# Patient Record
Sex: Male | Born: 1975
Health system: Southern US, Community
[De-identification: ages and names within clinical notes are randomized; demographics above are authoritative.]

## PROBLEM LIST (undated history)

## (undated) DIAGNOSIS — I10 Essential (primary) hypertension: Secondary | ICD-10-CM

---

## 2002-08-23 ENCOUNTER — Emergency Department (HOSPITAL_COMMUNITY): Admission: EM | Admit: 2002-08-23 | Discharge: 2002-08-23 | Payer: Self-pay | Admitting: Emergency Medicine

## 2002-08-23 ENCOUNTER — Encounter: Payer: Self-pay | Admitting: Emergency Medicine

## 2006-03-14 ENCOUNTER — Emergency Department (HOSPITAL_COMMUNITY): Admission: EM | Admit: 2006-03-14 | Discharge: 2006-03-14 | Payer: Self-pay | Admitting: Emergency Medicine

## 2006-03-24 ENCOUNTER — Ambulatory Visit (HOSPITAL_COMMUNITY): Admission: RE | Admit: 2006-03-24 | Discharge: 2006-03-24 | Payer: Self-pay | Admitting: Chiropractic Medicine

## 2016-01-10 ENCOUNTER — Emergency Department (HOSPITAL_COMMUNITY): Payer: No Typology Code available for payment source

## 2016-01-10 ENCOUNTER — Encounter (HOSPITAL_COMMUNITY): Payer: Self-pay | Admitting: Emergency Medicine

## 2016-01-10 ENCOUNTER — Ambulatory Visit: Payer: Self-pay

## 2016-01-10 ENCOUNTER — Emergency Department (HOSPITAL_COMMUNITY)
Admission: EM | Admit: 2016-01-10 | Discharge: 2016-01-10 | Disposition: A | Discharge status: Home / Self Care | Payer: No Typology Code available for payment source | Attending: Emergency Medicine | Admitting: Emergency Medicine

## 2016-01-10 DIAGNOSIS — R531 Weakness: Secondary | ICD-10-CM | POA: Insufficient documentation

## 2016-01-10 DIAGNOSIS — R109 Unspecified abdominal pain: Secondary | ICD-10-CM | POA: Diagnosis not present

## 2016-01-10 DIAGNOSIS — R0789 Other chest pain: Secondary | ICD-10-CM | POA: Diagnosis not present

## 2016-01-10 DIAGNOSIS — M25512 Pain in left shoulder: Secondary | ICD-10-CM | POA: Insufficient documentation

## 2016-01-10 DIAGNOSIS — Y999 Unspecified external cause status: Secondary | ICD-10-CM | POA: Diagnosis not present

## 2016-01-10 DIAGNOSIS — M542 Cervicalgia: Secondary | ICD-10-CM | POA: Diagnosis not present

## 2016-01-10 DIAGNOSIS — Y939 Activity, unspecified: Secondary | ICD-10-CM | POA: Insufficient documentation

## 2016-01-10 DIAGNOSIS — Y92410 Unspecified street and highway as the place of occurrence of the external cause: Secondary | ICD-10-CM | POA: Diagnosis not present

## 2016-01-10 LAB — I-STAT CHEM 8, ED
BUN: 14 mg/dL (ref 6–20)
Calcium, Ion: 1.17 mmol/L (ref 1.12–1.23)
Chloride: 103 mmol/L (ref 101–111)
Creatinine, Ser: 1 mg/dL (ref 0.61–1.24)
GLUCOSE: 97 mg/dL (ref 65–99)
HEMATOCRIT: 47 % (ref 39.0–52.0)
HEMOGLOBIN: 16 g/dL (ref 13.0–17.0)
POTASSIUM: 4.1 mmol/L (ref 3.5–5.1)
Sodium: 141 mmol/L (ref 135–145)
TCO2: 26 mmol/L (ref 0–100)

## 2016-01-10 MED ORDER — IOPAMIDOL (ISOVUE-300) INJECTION 61%
INTRAVENOUS | Status: AC
  Administered 2016-01-10: 100 mL
  Filled 2016-01-10: qty 100

## 2016-01-10 MED ORDER — CYCLOBENZAPRINE HCL 10 MG PO TABS: 10.0000 mg | ORAL_TABLET | Freq: Every day | ORAL | Status: DC
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
## 2016-02-19 ENCOUNTER — Ambulatory Visit (INDEPENDENT_AMBULATORY_CARE_PROVIDER_SITE_OTHER): Payer: Commercial Managed Care - PPO

## 2016-02-19 ENCOUNTER — Ambulatory Visit (INDEPENDENT_AMBULATORY_CARE_PROVIDER_SITE_OTHER): Payer: Commercial Managed Care - PPO | Admitting: Physician Assistant

## 2016-02-19 VITALS — BP 184/98 | HR 112 | Temp 98.0°F | Resp 17 | Ht 68.0 in | Wt 233.0 lb

## 2016-02-19 DIAGNOSIS — M542 Cervicalgia: Secondary | ICD-10-CM | POA: Diagnosis not present

## 2016-02-19 DIAGNOSIS — R51 Headache: Secondary | ICD-10-CM | POA: Diagnosis not present

## 2016-02-19 DIAGNOSIS — F0781 Postconcussional syndrome: Secondary | ICD-10-CM

## 2016-02-19 DIAGNOSIS — R519 Headache, unspecified: Secondary | ICD-10-CM

## 2016-02-19 LAB — POCT CBC
Granulocyte percent: 50 %G (ref 37–80)
HEMATOCRIT: 47.5 % (ref 43.5–53.7)
HEMOGLOBIN: 16.1 g/dL (ref 14.1–18.1)
LYMPH, POC: 3.9 — AB (ref 0.6–3.4)
MCH, POC: 26.4 pg — AB (ref 27–31.2)
MCHC: 33.9 g/dL (ref 31.8–35.4)
MCV: 77.9 fL — AB (ref 80–97)
MID (CBC): 0.7 (ref 0–0.9)
MPV: 8.5 fL (ref 0–99.8)
POC GRANULOCYTE: 4.8 (ref 2–6.9)
POC LYMPH %: 41.5 % (ref 10–50)
POC MID %: 7.9 %M (ref 0–12)
Platelet Count, POC: 199 10*3/uL (ref 142–424)
RBC: 6.1 M/uL (ref 4.69–6.13)
RDW, POC: 14.4 %
WBC: 9.4 10*3/uL (ref 4.6–10.2)

## 2016-02-19 LAB — BASIC METABOLIC PANEL
BUN: 11 mg/dL (ref 7–25)
CALCIUM: 9.9 mg/dL (ref 8.6–10.3)
CO2: 28 mmol/L (ref 20–31)
CREATININE: 1 mg/dL (ref 0.60–1.35)
Chloride: 103 mmol/L (ref 98–110)
Glucose, Bld: 88 mg/dL (ref 65–99)
Potassium: 4.7 mmol/L (ref 3.5–5.3)
Sodium: 141 mmol/L (ref 135–146)

## 2016-02-19 MED ORDER — AMITRIPTYLINE HCL 10 MG PO TABS: 25.0000 mg | ORAL_TABLET | Freq: Every day | ORAL | Status: DC

## 2016-02-19 MED ORDER — CYCLOBENZAPRINE HCL 10 MG PO TABS: 10.0000 mg | ORAL_TABLET | Freq: Three times a day (TID) | ORAL | 0 refills | Status: DC | PRN

## 2016-02-19 MED ORDER — AMITRIPTYLINE HCL 25 MG PO TABS: 25.0000 mg | ORAL_TABLET | Freq: Every day | ORAL | 1 refills | Status: DC

## 2016-02-19 MED ORDER — CYCLOBENZAPRINE HCL 10 MG PO TABS
10.0000 mg | ORAL_TABLET | Freq: Two times a day (BID) | ORAL | 0 refills | Status: DC | PRN
Start: 2016-02-19 — End: 2020-04-16
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
## 2016-02-19 NOTE — Patient Instructions (Addendum)
     IF you received an x-ray today, you will receive an invoice from [facility]. Please contact [facility] at [phone] with questions or concerns regarding your invoice.   IF you received labwork today, you will receive an invoice from [employer]. Please contact [facility] at [phone] with questions or concerns regarding your invoice.   Our billing staff will not be able to assist you with questions regarding bills from these companies.  You will be contacted with the lab results as soon as they are available. The fastest way to get your results is to activate your My Chart account. Instructions are located on the last page of this paperwork. If you have not heard from [place] regarding the results in 2 weeks, please contact this office.     I am placing a referral to neurology. We will try the amitriptyline. Please take the flexeril twice per day as needed.  Do not take at night with the amitriptyline at this time.

## 2016-04-15 ENCOUNTER — Other Ambulatory Visit: Payer: Self-pay | Admitting: Physician Assistant

## 2016-04-15 DIAGNOSIS — R519 Headache, unspecified: Secondary | ICD-10-CM

## 2016-04-15 DIAGNOSIS — R51 Headache: Principal | ICD-10-CM

## 2016-04-15 DIAGNOSIS — M542 Cervicalgia: Secondary | ICD-10-CM

## 2016-04-19 ENCOUNTER — Encounter: Payer: Self-pay | Admitting: Neurology

## 2016-04-19 ENCOUNTER — Ambulatory Visit (INDEPENDENT_AMBULATORY_CARE_PROVIDER_SITE_OTHER): Payer: Commercial Managed Care - PPO | Admitting: Neurology

## 2016-04-19 VITALS — Ht 70.0 in | Wt 240.0 lb

## 2016-04-19 DIAGNOSIS — G44219 Episodic tension-type headache, not intractable: Secondary | ICD-10-CM

## 2016-04-19 DIAGNOSIS — H8110 Benign paroxysmal vertigo, unspecified ear: Secondary | ICD-10-CM | POA: Diagnosis not present

## 2016-04-19 MED ORDER — AMITRIPTYLINE HCL 50 MG PO TABS: 50.0000 mg | ORAL_TABLET | Freq: Every day | ORAL | 2 refills | Status: DC
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
## 2016-04-19 NOTE — Patient Instructions (Signed)
1.  We will increase amitriptyline to 50mg  at bedtime.  Contact me in 6 weeks with update. 2.  Limit use of ibuprofen to no more than 2 days out of the week 3.  If neck is still painful, may use cyclobenzaprine at bedtime as needed. 4.  You may return to work 5.  Follow up in 3 months.

## 2016-07-09 ENCOUNTER — Other Ambulatory Visit: Payer: Self-pay | Admitting: Neurology

## 2016-07-20 ENCOUNTER — Ambulatory Visit: Payer: Commercial Managed Care - PPO | Admitting: Neurology

## 2016-07-30 ENCOUNTER — Encounter: Payer: Self-pay | Admitting: Neurology

## 2016-10-03 ENCOUNTER — Other Ambulatory Visit: Payer: Self-pay | Admitting: Neurology

## 2016-10-04 ENCOUNTER — Other Ambulatory Visit: Payer: Self-pay | Admitting: *Deleted

## 2016-10-04 MED ORDER — AMITRIPTYLINE HCL 50 MG PO TABS: 50.0000 mg | ORAL_TABLET | Freq: Every day | ORAL | 0 refills | Status: DC

## 2016-10-04 NOTE — Telephone Encounter (Signed)
Rx sent 

## 2016-10-31 ENCOUNTER — Other Ambulatory Visit: Payer: Self-pay | Admitting: Neurology

## 2016-11-30 ENCOUNTER — Other Ambulatory Visit: Payer: Self-pay | Admitting: Neurology

## 2017-01-04 ENCOUNTER — Emergency Department (HOSPITAL_COMMUNITY)
Admission: EM | Admit: 2017-01-04 | Discharge: 2017-01-04 | Disposition: A | Discharge status: Home / Self Care | Payer: Commercial Managed Care - PPO | Attending: Emergency Medicine | Admitting: Emergency Medicine

## 2017-01-04 ENCOUNTER — Encounter (HOSPITAL_COMMUNITY): Payer: Self-pay | Admitting: *Deleted

## 2017-01-04 DIAGNOSIS — Z79899 Other long term (current) drug therapy: Secondary | ICD-10-CM | POA: Diagnosis not present

## 2017-01-04 DIAGNOSIS — M778 Other enthesopathies, not elsewhere classified: Secondary | ICD-10-CM

## 2017-01-04 DIAGNOSIS — M545 Low back pain, unspecified: Secondary | ICD-10-CM

## 2017-01-04 DIAGNOSIS — M779 Enthesopathy, unspecified: Secondary | ICD-10-CM | POA: Insufficient documentation

## 2017-01-04 MED ORDER — IBUPROFEN 600 MG PO TABS: 600.0000 mg | ORAL_TABLET | Freq: Four times a day (QID) | ORAL | 0 refills | Status: DC | PRN

## 2017-01-04 MED ORDER — CYCLOBENZAPRINE HCL 10 MG PO TABS: 10.0000 mg | ORAL_TABLET | Freq: Two times a day (BID) | ORAL | 0 refills | Status: DC | PRN

## 2017-01-04 NOTE — Discharge Instructions (Signed)
He was seen today for back pain and right elbow pain. He likely have a back strain and tendinitis in her right elbow. Anti-inflammatories are the mainstay of treatment.  Follow-up with your primary doctor.

## 2017-01-04 NOTE — ED Triage Notes (Signed)
Pt c/o R elbow and back pain x 1 month. 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 Has tried tylenol and ibuprofen without relief.

## 2017-01-04 NOTE — ED Provider Notes (Signed)
[facility] Provider Note   CSN: [id number] Arrival date & time: 01/04/17  0509     History   Chief Complaint Chief Complaint  Patient presents with  . Back Pain    HPI Curtis Washington is a 41 y.o. male.  HPI  This is a 41 year old male with no significant past medical history who presents with back pain and right elbow pain. 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 States he has had progressively worsening lower back pain that is nonradiating and right sided over the last month. No difficulty ambulating. No difficulty with bowel or bladder.  Denies weakness, numbness, tingling of the lower extremities. Has taken ibuprofen and Tylenol with minimal relief. Current pain is 8 out of 10. He also reports waxing and waning right elbow pain. He is left-handed. Denies any fevers, redness or swelling to the joint. Pain does not radiate.  History reviewed. No pertinent past medical history.  There are no active problems to display for this patient.   History reviewed. No pertinent surgical history.     Home Medications    Prior to Admission medications   Medication Sig Start Date End Date Taking? Authorizing Provider  amitriptyline (ELAVIL) 50 MG tablet TAKE 1 TABLET (50 MG TOTAL) BY MOUTH AT BEDTIME. 11/01/16   Everlena Cooper, Adam R, DO  cyclobenzaprine (FLEXERIL) 10 MG tablet Take 1 tablet (10 mg total) by mouth 2 (two) times daily as needed for muscle spasms. 02/19/16   Trena Platt D, PA  cyclobenzaprine (FLEXERIL) 10 MG tablet Take 1 tablet (10 mg total) by mouth 2 (two) times daily as needed for muscle spasms. 01/04/17   Kashish Yglesias, Mayer Masker, MD  ibuprofen (ADVIL,MOTRIN) 600 MG tablet Take 1 tablet (600 mg total) by mouth every 6 (six) hours as needed. 01/04/17   Marylon Verno, Mayer Masker, MD    Family History Family History  Problem Relation Age of Onset  . Diabetes Mother   . Hyperlipidemia Mother   . Diabetes Sister   . Heart disease Sister   .  Hyperlipidemia Sister     Social History Social History  Substance Use Topics  . Smoking status: Never Smoker  . Smokeless tobacco: Never Used  . Alcohol use Yes     Comment: socially     Allergies   Patient has no known allergies.   Review of Systems Review of Systems  Constitutional: Negative for fever.  Musculoskeletal: Positive for back pain.       Elbow pain  Neurological: Negative for weakness and numbness.  All other systems reviewed and are negative.    Physical Exam Updated Vital Signs BP (!) 174/115 (BP Location: Left Arm)   Pulse (!) 110   Temp 97.9 F (36.6 C) (Oral)   Resp 16   Ht 5\' 10"  (1.778 m)   Wt 109.6 kg (241 lb 9 oz)   SpO2 100%   BMI 34.66 kg/m   Physical Exam  Constitutional: He is oriented to person, place, and time. He appears well-developed and well-nourished.  Overweight, no acute distress  HENT:  Head: Normocephalic and atraumatic.  Cardiovascular: Normal rate and regular rhythm.   Pulmonary/Chest: Effort normal. No respiratory distress.  Musculoskeletal: He exhibits no edema.  Normal range of motion of the right elbow and shoulder, no overlying skin changes, no significant swelling, 2+ radial pulse Tenderness palpation right paraspinous muscle region of the lumbar spine, no midline step-off, deformity, or tenderness, negative straight leg raises  Neurological: He  is alert and oriented to person, place, and time.  5/5 strength bilateral lower extremities, normal reflexes  Skin: Skin is warm and dry.  Psychiatric: He has a normal mood and affect.  Nursing note and vitals reviewed.    ED Treatments / Results  Labs (all labs ordered are listed, but only abnormal results are displayed) Labs Reviewed - No data to display  EKG  EKG Interpretation None       Radiology No results found.  Procedures Procedures (including critical care time)  Medications Ordered in ED Medications - No data to display   Initial  Impression / Assessment and Plan / ED Course  I have reviewed the triage vital signs and the nursing notes.  Pertinent labs & imaging results that were available during my care of the patient were reviewed by me and considered in my medical decision making (see chart for details).     Patient presents with acute on chronic elbow pain and back pain. Does manual labor for a job.  He likely has some tendinitis and muscle strain given his job. No indication for imaging at this time. No signs or symptoms of cauda equina. No evidence of elbow effusion or action. Recommend scheduled NSAIDs and Flexeril as needed for breakthrough. He was advised not to operate heavy machinery under the influence of muscle relaxers.  After history, exam, and medical workup I feel the patient has been appropriately medically screened and is safe for discharge home. Pertinent diagnoses were discussed with the patient. Patient was given return precautions.   Final Clinical Impressions(s) / ED Diagnoses   Final diagnoses:  Acute right-sided low back pain without sciatica  Right elbow tendonitis    New Prescriptions New Prescriptions   CYCLOBENZAPRINE (FLEXERIL) 10 MG TABLET    Take 1 tablet (10 mg total) by mouth 2 (two) times daily as needed for muscle spasms.   IBUPROFEN (ADVIL,MOTRIN) 600 MG TABLET    Take 1 tablet (600 mg total) by mouth every 6 (six) hours as needed.     Shon BatonHorton, Burle Kwan F, MD 01/04/17 [phone]
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
## 2018-08-19 ENCOUNTER — Other Ambulatory Visit: Payer: Self-pay

## 2018-08-19 ENCOUNTER — Other Ambulatory Visit
Admission: RE | Admit: 2018-08-19 | Discharge: 2018-08-19 | Disposition: A | Discharge status: Home / Self Care | Payer: Commercial Managed Care - PPO | Source: Ambulatory Visit | Attending: Family Medicine | Admitting: Family Medicine

## 2018-08-19 ENCOUNTER — Ambulatory Visit
Admission: EM | Admit: 2018-08-19 | Discharge: 2018-08-19 | Disposition: A | Discharge status: Home / Self Care | Payer: Commercial Managed Care - PPO | Attending: Family Medicine | Admitting: Family Medicine

## 2018-08-19 ENCOUNTER — Encounter: Payer: Self-pay | Admitting: Family Medicine

## 2018-08-19 DIAGNOSIS — R233 Spontaneous ecchymoses: Secondary | ICD-10-CM

## 2018-08-19 LAB — CBC WITH DIFFERENTIAL/PLATELET
Abs Immature Granulocytes: 0.02 10*3/uL (ref 0.00–0.07)
Basophils Absolute: 0.1 10*3/uL (ref 0.0–0.1)
Basophils Relative: 1 %
Eosinophils Absolute: 0.1 10*3/uL (ref 0.0–0.5)
Eosinophils Relative: 2 %
HCT: 50.9 % (ref 39.0–52.0)
Hemoglobin: 16.2 g/dL (ref 13.0–17.0)
Immature Granulocytes: 0 %
Lymphocytes Relative: 37 %
Lymphs Abs: 2.9 10*3/uL (ref 0.7–4.0)
MCH: 25.8 pg — ABNORMAL LOW (ref 26.0–34.0)
MCHC: 31.8 g/dL (ref 30.0–36.0)
MCV: 81.2 fL (ref 80.0–100.0)
Monocytes Absolute: 0.8 10*3/uL (ref 0.1–1.0)
Monocytes Relative: 10 %
NEUTROS PCT: 50 %
NRBC: 0 % (ref 0.0–0.2)
Neutro Abs: 4 10*3/uL (ref 1.7–7.7)
Platelets: 256 10*3/uL (ref 150–400)
RBC: 6.27 MIL/uL — ABNORMAL HIGH (ref 4.22–5.81)
RDW: 15.6 % — AB (ref 11.5–15.5)
WBC: 7.8 10*3/uL (ref 4.0–10.5)

## 2018-08-19 LAB — BASIC METABOLIC PANEL
Anion gap: 6 (ref 5–15)
BUN: 12 mg/dL (ref 6–20)
CALCIUM: 10.1 mg/dL (ref 8.9–10.3)
CO2: 30 mmol/L (ref 22–32)
Chloride: 106 mmol/L (ref 98–111)
Creatinine, Ser: 0.97 mg/dL (ref 0.61–1.24)
GFR calc Af Amer: >60 mL/min (ref >60)
Glucose, Bld: 97 mg/dL (ref 70–99)
Potassium: 5 mmol/L (ref 3.5–5.1)
SODIUM: 142 mmol/L (ref 135–145)

## 2018-08-19 MED ORDER — PREDNISONE 10 MG PO TABS: 40.0000 mg | ORAL_TABLET | Freq: Every day | ORAL | 0 refills | Status: AC

## 2018-08-19 NOTE — ED Provider Notes (Signed)
[facility]    CSN: [id number] Arrival date & time: 08/19/18  1134     History   Chief Complaint Chief Complaint  Patient presents with  . Rash    HPI Curtis Washington is a 43 y.o. male.   Patient is a 43 year old male presents with petechial rash to bilateral lower extremities.  He noticed this this morning.  Symptoms have been constant.  He took his son to college yesterday.  Denies being in contact with anything unusual.  Denies staying in any hotels or being in any other persons house.  There is no itching or pain.  He denies any fever, chills, myalgias, dizziness, shortness of breath, chest pain. Denies any recent changes in lotions, detergents, foods or other possible irritants. No recent travel. Nobody else at home has the rash. Patient has been outside but denies any contact with plants or insects. No new foods or medications.   ROS per HPI        History reviewed. No pertinent past medical history.  There are no active problems to display for this patient.   History reviewed. No pertinent surgical history.     Home Medications    Prior to Admission medications   Medication Sig Start Date End Date Taking? Authorizing Provider  predniSONE (DELTASONE) 10 MG tablet Take 4 tablets (40 mg total) by mouth daily for 5 days. 08/19/18 08/24/18  Janace ArisBast, Roddrick Sharron A, NP    Family History History reviewed. No pertinent family history.  Social History Social History   Tobacco Use  . Smoking status: Never Smoker  . Smokeless tobacco: Never Used  Substance Use Topics  . Alcohol use: Never    Frequency: Never  . Drug use: Not Currently     Allergies   Patient has no allergy information on record.   Review of Systems Review of Systems   Physical Exam Triage Vital Signs ED Triage Vitals  Enc Vitals Group     BP 08/19/18 1227 (!) 157/107     Pulse Rate 08/19/18 1227 81     Resp 08/19/18 1227 16     Temp 08/19/18 1227 (!) 97.4 F (36.3 C)     Temp  Source 08/19/18 1227 Oral     SpO2 08/19/18 1227 96 %     Weight 08/19/18 1226 225 lb (102.1 kg)     Height 08/19/18 1226 5\' 10"  (1.778 m)     Head Circumference --      Peak Flow --      Pain Score 08/19/18 1226 0     Pain Loc --      Pain Edu? --      Excl. in GC? --    No data found.  Updated Vital Signs BP (!) 157/107 (BP Location: Right Arm)   Pulse 81   Temp (!) 97.4 F (36.3 C) (Oral)   Resp 16   Ht 5\' 10"  (1.778 m)   Wt 225 lb (102.1 kg)   SpO2 96%   BMI 32.28 kg/m   Visual Acuity Right Eye Distance:   Left Eye Distance:   Bilateral Distance:    Right Eye Near:   Left Eye Near:    Bilateral Near:     Physical Exam Vitals signs and nursing note reviewed.  Constitutional:      Appearance: He is well-developed. He is not ill-appearing or toxic-appearing.  HENT:     Head: Normocephalic and atraumatic.     Nose: Nose normal.  Eyes:  Conjunctiva/sclera: Conjunctivae normal.  Neck:     Musculoskeletal: Neck supple.  Cardiovascular:     Rate and Rhythm: Normal rate and regular rhythm.     Heart sounds: No murmur.  Pulmonary:     Effort: Pulmonary effort is normal. No respiratory distress.     Breath sounds: Normal breath sounds.  Abdominal:     Palpations: Abdomen is soft.     Tenderness: There is no abdominal tenderness.  Musculoskeletal: Normal range of motion.     Right lower leg: No edema.     Left lower leg: No edema.  Skin:    General: Skin is warm and dry.     Findings: Rash present.     Comments: Petechial rash to BLE Non blanching   Neurological:     Mental Status: He is alert.  Psychiatric:        Mood and Affect: Mood normal.        UC Treatments / Results  Labs (all labs ordered are listed, but only abnormal results are displayed) Labs Reviewed  CBC WITH DIFFERENTIAL/PLATELET  BASIC METABOLIC PANEL    EKG None  Radiology No results found.  Procedures Procedures (including critical care time)  Medications Ordered  in UC Medications - No data to display  Initial Impression / Assessment and Plan / UC Course  I have reviewed the triage vital signs and the nursing notes.  Pertinent labs & imaging results that were available during my care of the patient were reviewed by me and considered in my medical decision making (see chart for details).     Petechial rash- will check CBC and CMP to r/o any serious problems Most likely vasculitis  Will give prednisone to see if this helps.  Follow up as needed for continued or worsening symptoms  Final Clinical Impressions(s) / UC Diagnoses   Final diagnoses:  Petechiae     Discharge Instructions     We are checking some blood work to rule out a few things related to your rash I will call you if there is any abnormalities. For now we will go ahead and treat this rash with prednisone daily for the next 5 days If the rash continues you may want to follow-up with your primary care for further evaluation and management    ED Prescriptions    Medication Sig Dispense Auth. Provider   predniSONE (DELTASONE) 10 MG tablet Take 4 tablets (40 mg total) by mouth daily for 5 days. 20 tablet Janace Aris, NP     Controlled Substance Prescriptions [place] Controlled Substance Registry consulted?no    Janace Aris, [place] 08/20/18 [phone]

## 2018-08-19 NOTE — Discharge Instructions (Signed)
We are checking some blood work to rule out a few things related to your rash I will call you if there is any abnormalities. For now we will go ahead and treat this rash with prednisone daily for the next 5 days If the rash continues you may want to follow-up with your primary care for further evaluation and management

## 2018-08-19 NOTE — ED Triage Notes (Signed)
Per pt woke up today with little red bumps all over his legs. No fever nor swelling. Some itching on Thursday but nothing was there. Alert oriented x 4

## 2018-08-20 ENCOUNTER — Encounter: Payer: Self-pay | Admitting: Family Medicine

## 2018-11-23 ENCOUNTER — Encounter (HOSPITAL_COMMUNITY): Payer: Self-pay | Admitting: *Deleted

## 2020-04-16 ENCOUNTER — Ambulatory Visit
Admission: EM | Admit: 2020-04-16 | Discharge: 2020-04-16 | Disposition: A | Discharge status: Home / Self Care | Payer: Commercial Managed Care - PPO | Attending: Emergency Medicine | Admitting: Emergency Medicine

## 2020-04-16 ENCOUNTER — Other Ambulatory Visit: Payer: Self-pay

## 2020-04-16 DIAGNOSIS — L02811 Cutaneous abscess of head [any part, except face]: Secondary | ICD-10-CM

## 2020-04-16 DIAGNOSIS — I10 Essential (primary) hypertension: Secondary | ICD-10-CM | POA: Diagnosis not present

## 2020-04-16 MED ORDER — AMLODIPINE BESYLATE 5 MG PO TABS: 5.0000 mg | ORAL_TABLET | Freq: Every day | ORAL | 0 refills | Status: DC

## 2020-04-16 MED ORDER — DOXYCYCLINE HYCLATE 100 MG PO CAPS: 100.0000 mg | ORAL_CAPSULE | Freq: Two times a day (BID) | ORAL | 0 refills | Status: AC

## 2020-04-16 NOTE — ED Triage Notes (Signed)
Pt states he has a bump on the back of his neck that comes and goes. Pt states has been there for approximately 3 months and it is irritating the back of his neck. Pt stated he wanted his blood pressure checked since he was here. Pt is aox4 and ambulatory.

## 2020-04-16 NOTE — ED Provider Notes (Signed)
[facility]    CSN: [id number] Arrival date & time: 04/16/20  1608      History   Chief Complaint Chief Complaint  Patient presents with   Rash    x 3 months    HPI Curtis Washington is a 44 y.o. male presenting today for evaluation of bump on neck and blood pressure.  Patient reports over the past few months he has had area of swelling that comes and goes to the base of his scalp/neck.  Of recently has developed increased pain and swelling around this area.  Does report some occasional drainage.  Denies fevers.  Also concerned about blood pressure.  Reports that it was elevated at work today.  Has been elevated in the past.  Denies any symptoms associated with this.  Denies any headaches, vision changes or dizziness.  Denies chest pain or shortness of breath.  Does not currently have a PCP.  HPI  History reviewed. No pertinent past medical history.  There are no problems to display for this patient.   History reviewed. No pertinent surgical history.     Home Medications    Prior to Admission medications   Medication Sig Start Date End Date Taking? Authorizing Provider  ibuprofen (ADVIL,MOTRIN) 600 MG tablet Take 1 tablet (600 mg total) by mouth every 6 (six) hours as needed. 01/04/17  Yes Horton, Mayer Masker, MD  amLODipine (NORVASC) 5 MG tablet Take 1 tablet (5 mg total) by mouth daily. 04/16/20   Haly Feher C, PA-C  doxycycline (VIBRAMYCIN) 100 MG capsule Take 1 capsule (100 mg total) by mouth 2 (two) times daily for 10 days. 04/16/20 04/26/20  Josphine Laffey C, PA-C  amitriptyline (ELAVIL) 50 MG tablet TAKE 1 TABLET (50 MG TOTAL) BY MOUTH AT BEDTIME. 11/01/16 04/16/20  Drema Dallas, DO    Family History Family History  Problem Relation Age of Onset   Diabetes Mother    Hyperlipidemia Mother    Diabetes Sister    Heart disease Sister    Hyperlipidemia Sister     Social History Social History   Tobacco Use   Smoking status: Never Smoker    Smokeless tobacco: Never Used  Building services engineer Use: Never used  Substance Use Topics   Alcohol use: Never    Comment: socially   Drug use: Not Currently     Allergies   Patient has no known allergies.   Review of Systems Review of Systems  Constitutional: Negative for fatigue and fever.  Eyes: Negative for redness, itching and visual disturbance.  Respiratory: Negative for shortness of breath.   Cardiovascular: Negative for chest pain and leg swelling.  Gastrointestinal: Negative for nausea and vomiting.  Musculoskeletal: Negative for arthralgias and myalgias.  Skin: Positive for color change. Negative for rash and wound.  Neurological: Negative for dizziness, syncope, weakness, light-headedness and headaches.     Physical Exam Triage Vital Signs ED Triage Vitals  Enc Vitals Group     BP 04/16/20 1634 (!) 170/114     Pulse Rate 04/16/20 1634 94     Resp 04/16/20 1634 18     Temp 04/16/20 1634 98.3 F (36.8 C)     Temp Source 04/16/20 1634 Oral     SpO2 04/16/20 1634 96 %     Weight --      Height --      Head Circumference --      Peak Flow --      Pain Score 04/16/20  1635 6     Pain Loc --      Pain Edu? --      Excl. in GC? --    No data found.  Updated Vital Signs BP (!) 170/114 (BP Location: Left Arm)    Pulse 94    Temp 98.3 F (36.8 C) (Oral)    Resp 18    SpO2 96%   Visual Acuity Right Eye Distance:   Left Eye Distance:   Bilateral Distance:    Right Eye Near:   Left Eye Near:    Bilateral Near:     Physical Exam Vitals and nursing note reviewed.  Constitutional:      Appearance: He is well-developed.     Comments: No acute distress  HENT:     Head: Normocephalic and atraumatic.     Nose: Nose normal.  Eyes:     Extraocular Movements: Extraocular movements intact.     Conjunctiva/sclera: Conjunctivae normal.     Pupils: Pupils are equal, round, and reactive to light.  Cardiovascular:     Rate and Rhythm: Normal rate and regular  rhythm.  Pulmonary:     Effort: Pulmonary effort is normal. No respiratory distress.     Comments: Breathing comfortably at rest, CTABL, no wheezing, rales or other adventitious sounds auscultated Abdominal:     General: There is no distension.  Musculoskeletal:        General: Normal range of motion.     Cervical back: Neck supple.  Skin:    General: Skin is warm and dry.     Comments: Base of scalp/nape of neck with area of swelling erythema and induration, no significant fluctuance, approximately 1 cm x 0.25 cm  Neurological:     Mental Status: He is alert and oriented to person, place, and time.      UC Treatments / Results  Labs (all labs ordered are listed, but only abnormal results are displayed) Labs Reviewed - No data to display  EKG   Radiology No results found.  Procedures Procedures (including critical care time)  Medications Ordered in UC Medications - No data to display  Initial Impression / Assessment and Plan / UC Course  I have reviewed the triage vital signs and the nursing notes.  Pertinent labs & imaging results that were available during my care of the patient were reviewed by me and considered in my medical decision making (see chart for details).     1.  Abscess versus infected/inflamed cyst-initiating doxycycline, warm compresses.  Discussed following up with surgery/Derm for removal if continuing to be recurrent.  2.  Hypertension-blood pressure elevated today, rechecked and was similar around 176/115.  Currently asymptomatic.  Initiating on amlodipine as past visits have all been elevated similar in nature.  Provided contact for PCP to establish care.  Discussed warning signs to follow-up for.  Discussed strict return precautions. Patient verbalized understanding and is agreeable with plan.   Final Clinical Impressions(s) / UC Diagnoses   Final diagnoses:  Scalp abscess  Essential hypertension     Discharge Instructions     Begin  doxycycline twice daily for 10 days treat abscess to neck Warm compresses Ibuprofen and Tylenol for pain  Begin amlodipine daily for blood pressure Continue to check pressure Follow-up in the emergency room developing any headaches, vision changes, difficulty speaking, facial drooping, one-sided weakness, chest pain shortness of breath    ED Prescriptions    Medication Sig Dispense Auth. Provider   doxycycline (VIBRAMYCIN) 100 MG  capsule Take 1 capsule (100 mg total) by mouth 2 (two) times daily for 10 days. 20 capsule Jaxtyn Linville C, PA-C   amLODipine (NORVASC) 5 MG tablet Take 1 tablet (5 mg total) by mouth daily. 60 tablet Shateka Petrea, Langford C, PA-C     PDMP not reviewed this encounter.   Lew Dawes, PA-C 04/16/20 1705

## 2020-04-16 NOTE — Discharge Instructions (Signed)
Begin doxycycline twice daily for 10 days treat abscess to neck Warm compresses Ibuprofen and Tylenol for pain  Begin amlodipine daily for blood pressure Continue to check pressure Follow-up in the emergency room developing any headaches, vision changes, difficulty speaking, facial drooping, one-sided weakness, chest pain shortness of breath

## 2020-06-30 ENCOUNTER — Ambulatory Visit
Admission: EM | Admit: 2020-06-30 | Discharge: 2020-06-30 | Disposition: A | Discharge status: Home / Self Care | Payer: Commercial Managed Care - PPO | Attending: Emergency Medicine | Admitting: Emergency Medicine

## 2020-06-30 ENCOUNTER — Encounter: Payer: Self-pay | Admitting: *Deleted

## 2020-06-30 DIAGNOSIS — L959 Vasculitis limited to the skin, unspecified: Secondary | ICD-10-CM | POA: Diagnosis not present

## 2020-06-30 DIAGNOSIS — R21 Rash and other nonspecific skin eruption: Secondary | ICD-10-CM | POA: Diagnosis not present

## 2020-06-30 HISTORY — DX: Essential (primary) hypertension: I10

## 2020-06-30 MED ORDER — PREDNISONE 10 MG PO TABS: ORAL_TABLET | ORAL | 0 refills | Status: DC

## 2020-06-30 NOTE — Discharge Instructions (Signed)
Blood work pending, I will call with these results if abnormal Begin prednisone taper over the next 6 days-begin with 6 tablets on day 1, decrease by 1 tablet each day until complete-6, 5, 4, 3, 2, 1-take with food in the morning Please follow-up if any symptoms not improving or worsening, rash spreading, developing fevers, dizziness, lightheadedness, nausea or vomiting, fatigue

## 2020-06-30 NOTE — ED Triage Notes (Signed)
Patient reports rash to bilateral legs, noticed Thanksgiving week. Left worse than right. Itches.

## 2020-07-01 LAB — COMPREHENSIVE METABOLIC PANEL
ALT: 31 IU/L (ref 0–44)
AST: 20 IU/L (ref 0–40)
Albumin/Globulin Ratio: 1.3 (ref 1.2–2.2)
Albumin: 4.5 g/dL (ref 4.0–5.0)
Alkaline Phosphatase: 68 IU/L (ref 44–121)
BUN/Creatinine Ratio: 13 (ref 9–20)
BUN: 12 mg/dL (ref 6–24)
Bilirubin Total: 0.3 mg/dL (ref 0.0–1.2)
CO2: 23 mmol/L (ref 20–29)
Calcium: 9.8 mg/dL (ref 8.7–10.2)
Chloride: 103 mmol/L (ref 96–106)
Creatinine, Ser: 0.95 mg/dL (ref 0.76–1.27)
GFR calc Af Amer: 111 mL/min/{1.73_m2} (ref >59)
GFR calc non Af Amer: 96 mL/min/{1.73_m2} (ref >59)
Globulin, Total: 3.5 g/dL (ref 1.5–4.5)
Glucose: 93 mg/dL (ref 65–99)
Potassium: 4.1 mmol/L (ref 3.5–5.2)
Sodium: 142 mmol/L (ref 134–144)
Total Protein: 8 g/dL (ref 6.0–8.5)

## 2020-07-01 LAB — CBC WITH DIFFERENTIAL/PLATELET
Basophils Absolute: 0.1 10*3/uL (ref 0.0–0.2)
Basos: 1 %
EOS (ABSOLUTE): 0.1 10*3/uL (ref 0.0–0.4)
Eos: 1 %
Hematocrit: 48.8 % (ref 37.5–51.0)
Hemoglobin: 15.9 g/dL (ref 13.0–17.7)
Immature Grans (Abs): 0 10*3/uL (ref 0.0–0.1)
Immature Granulocytes: 0 %
Lymphocytes Absolute: 4.1 10*3/uL — ABNORMAL HIGH (ref 0.7–3.1)
Lymphs: 35 %
MCH: 25.8 pg — ABNORMAL LOW (ref 26.6–33.0)
MCHC: 32.6 g/dL (ref 31.5–35.7)
MCV: 79 fL (ref 79–97)
Monocytes Absolute: 0.9 10*3/uL (ref 0.1–0.9)
Monocytes: 7 %
Neutrophils Absolute: 6.4 10*3/uL (ref 1.4–7.0)
Neutrophils: 56 %
Platelets: 272 10*3/uL (ref 150–450)
RBC: 6.17 x10E6/uL — ABNORMAL HIGH (ref 4.14–5.80)
RDW: 14.3 % (ref 11.6–15.4)
WBC: 11.5 10*3/uL — ABNORMAL HIGH (ref 3.4–10.8)

## 2020-07-01 LAB — ROCKY MTN SPOTTED FVR ABS PNL(IGG+IGM)
RMSF IgG: NEGATIVE
RMSF IgM: 0.88 index (ref 0.00–0.89)

## 2020-07-01 LAB — B. BURGDORFI ANTIBODIES: Lyme IgG/IgM Ab: <0.91 {ISR} (ref 0.00–0.90)

## 2020-07-01 NOTE — ED Provider Notes (Signed)
[facility]    CSN: [id number] Arrival date & time: 06/30/20  1610      History   Chief Complaint Chief Complaint  Patient presents with  . Rash    HPI Curtis Washington is a 44 y.o. male history of hypertension presenting today for evaluation of a rash.  Patient has noticed a rash to bilateral lower legs for approximately 1 to 2 weeks.  Symptoms worse on the left compared to right, reports associated itching.  Denies any pain.  He denies any systemic symptoms of fever, nausea, vomiting, fatigue, dizziness or lightheadedness.  Unsure of any possible bites of recently.  Did have similar rash approximately 1.5 years ago that resolved with prednisone.  At time of blood work which was obtained which was unremarkable.  Rash slightly worse this time around.  HPI  Past Medical History:  Diagnosis Date  . Hypertension     There are no problems to display for this patient.   History reviewed. No pertinent surgical history.     Home Medications    Prior to Admission medications   Medication Sig Start Date End Date Taking? Authorizing Provider  amLODipine (NORVASC) 5 MG tablet Take 1 tablet (5 mg total) by mouth daily. 04/16/20   Seve Monette C, PA-C  ibuprofen (ADVIL,MOTRIN) 600 MG tablet Take 1 tablet (600 mg total) by mouth every 6 (six) hours as needed. 01/04/17   Horton, Mayer Masker, MD  predniSONE (DELTASONE) 10 MG tablet Begin with 6 tabs on day 1, 5 tab on day 2, 4 tab on day 3, 3 tab on day 4, 2 tab on day 5, 1 tab on day 6-take with food 06/30/20   Keilana Morlock, [employer] C, PA-C  amitriptyline (ELAVIL) 50 MG tablet TAKE 1 TABLET (50 MG TOTAL) BY MOUTH AT BEDTIME. 11/01/16 04/16/20  Drema Dallas, DO    Family History Family History  Problem Relation Age of Onset  . Diabetes Mother   . Hyperlipidemia Mother   . Diabetes Sister   . Heart disease Sister   . Hyperlipidemia Sister     Social History Social History   Tobacco Use  . Smoking status: Never Smoker  .  Smokeless tobacco: Never Used  Vaping Use  . Vaping Use: Never used  Substance Use Topics  . Alcohol use: Never    Comment: socially  . Drug use: Not Currently     Allergies   Patient has no known allergies.   Review of Systems Review of Systems  Constitutional: Negative for fatigue and fever.  Eyes: Negative for redness, itching and visual disturbance.  Respiratory: Negative for shortness of breath.   Cardiovascular: Negative for chest pain and leg swelling.  Gastrointestinal: Negative for nausea and vomiting.  Musculoskeletal: Negative for arthralgias and myalgias.  Skin: Positive for color change and rash. Negative for wound.  Neurological: Negative for dizziness, syncope, weakness, light-headedness and headaches.     Physical Exam Triage Vital Signs ED Triage Vitals  Enc Vitals Group     BP 06/30/20 1818 (!) 187/116     Pulse Rate 06/30/20 1818 100     Resp 06/30/20 1818 17     Temp 06/30/20 1818 98 F (36.7 C)     Temp Source 06/30/20 1818 Oral     SpO2 06/30/20 1818 95 %     Weight --      Height --      Head Circumference --      Peak Flow --  Pain Score 06/30/20 1817 0     Pain Loc --      Pain Edu? --      Excl. in GC? --    No data found.  Updated Vital Signs BP (!) 187/116 (BP Location: Left Arm) Comment: has not been compliant with BP meds  Pulse 100   Temp 98 F (36.7 C) (Oral)   Resp 17   SpO2 95%   Visual Acuity Right Eye Distance:   Left Eye Distance:   Bilateral Distance:    Right Eye Near:   Left Eye Near:    Bilateral Near:     Physical Exam Vitals and nursing note reviewed.  Constitutional:      Appearance: He is well-developed.     Comments: No acute distress  HENT:     Head: Normocephalic and atraumatic.     Nose: Nose normal.  Eyes:     Conjunctiva/sclera: Conjunctivae normal.  Cardiovascular:     Rate and Rhythm: Normal rate.  Pulmonary:     Effort: Pulmonary effort is normal. No respiratory distress.   Abdominal:     General: There is no distension.  Musculoskeletal:        General: Normal range of motion.     Cervical back: Neck supple.  Skin:    General: Skin is warm and dry.     Comments: Bilateral lower legs with nonblanchable petechial/vasculitic rash, nontender to palpation, no palpable edema  Neurological:     Mental Status: He is alert and oriented to person, place, and time.          UC Treatments / Results  Labs (all labs ordered are listed, but only abnormal results are displayed) Labs Reviewed  CBC WITH DIFFERENTIAL/PLATELET  COMPREHENSIVE METABOLIC PANEL  B. BURGDORFI ANTIBODIES  ROCKY MTN SPOTTED FVR ABS PNL(IGG+IGM)    EKG   Radiology No results found.  Procedures Procedures (including critical care time)  Medications Ordered in UC Medications - No data to display  Initial Impression / Assessment and Plan / UC Course  I have reviewed the triage vital signs and the nursing notes.  Pertinent labs & imaging results that were available during my care of the patient were reviewed by me and considered in my medical decision making (see chart for details).     Vasculitic rash, 2 circular lesions, one with central clearing and given near ankles will go ahead and screen blood work for tickborne illness as well as check CBC/CMP.  In the interim we will retreat with prednisone given this resolved previously with it.  No systemic symptoms at this time, lower suspicion of ITP/TTP.  Discussed strict return precautions. Patient verbalized understanding and is agreeable with plan.  Final Clinical Impressions(s) / UC Diagnoses   Final diagnoses:  Vasculitis of skin  Rash and nonspecific skin eruption     Discharge Instructions     Blood work pending, I will call with these results if abnormal Begin prednisone taper over the next 6 days-begin with 6 tablets on day 1, decrease by 1 tablet each day until complete-6, 5, 4, 3, 2, 1-take with food in the  morning Please follow-up if any symptoms not improving or worsening, rash spreading, developing fevers, dizziness, lightheadedness, nausea or vomiting, fatigue   ED Prescriptions    Medication Sig Dispense Auth. Provider   predniSONE (DELTASONE) 10 MG tablet Begin with 6 tabs on day 1, 5 tab on day 2, 4 tab on day 3, 3 tab on day 4,  2 tab on day 5, 1 tab on day 6-take with food 21 tablet Martavia Tye, Milton C, PA-C     PDMP not reviewed this encounter.   Lew Dawes, PA-C 07/01/20 1212

## 2020-07-22 ENCOUNTER — Other Ambulatory Visit: Payer: Self-pay

## 2020-07-22 ENCOUNTER — Encounter: Payer: Self-pay | Admitting: Family

## 2020-07-22 ENCOUNTER — Ambulatory Visit: Payer: Commercial Managed Care - PPO | Admitting: Family

## 2020-07-22 VITALS — BP 159/127 | HR 109 | Ht 69.65 in | Wt 242.0 lb

## 2020-07-22 DIAGNOSIS — L989 Disorder of the skin and subcutaneous tissue, unspecified: Secondary | ICD-10-CM | POA: Diagnosis not present

## 2020-07-22 DIAGNOSIS — L259 Unspecified contact dermatitis, unspecified cause: Secondary | ICD-10-CM

## 2020-07-22 DIAGNOSIS — Z7689 Persons encountering health services in other specified circumstances: Secondary | ICD-10-CM | POA: Diagnosis not present

## 2020-07-22 DIAGNOSIS — Z2821 Immunization not carried out because of patient refusal: Secondary | ICD-10-CM

## 2020-07-22 DIAGNOSIS — I10 Essential (primary) hypertension: Secondary | ICD-10-CM | POA: Diagnosis not present

## 2020-07-22 MED ORDER — AMLODIPINE BESYLATE 5 MG PO TABS: 5.0000 mg | ORAL_TABLET | Freq: Every day | ORAL | 0 refills | Status: DC

## 2020-07-22 MED ORDER — CLONIDINE HCL 0.1 MG PO TABS
0.1000 mg | ORAL_TABLET | Freq: Once | ORAL | Status: AC
  Administered 2020-07-22: 0.1 mg via ORAL

## 2020-07-22 MED ORDER — LOSARTAN POTASSIUM 25 MG PO TABS
25.0000 mg | ORAL_TABLET | Freq: Every day | ORAL | 0 refills | Status: DC
Start: 2020-07-22 — End: 2020-08-13

## 2020-07-22 NOTE — Progress Notes (Signed)
Establish care  Blood pressure issue Rash on both legs

## 2020-07-22 NOTE — Progress Notes (Signed)
Subjective:    Curtis Washington - 44 y.o. male MRN [id number]  Date of birth: September 25, 1975  HPI  Curtis Washington is to establish care. Patient has no significant PMH.   Current issues and/or concerns: 1. HYPERTENSION FOLLOW-UP: 04/16/2020: Visit at [facility]. Patient with hypertension and Amlodipine initiated.   06/26/2020: Currently taking: see medication list Have you taken your blood pressure medication today: [x]  Yes []  No  Med Adherence: [x]  Yes    []  No Medication side effects: []  Yes    [x]  No Adherence with salt restriction: []  Yes    [x]  No  Exercise: Yes []  No [x]    Home Monitoring?: [x]  Yes, checking at work at least once per week Monitoring Frequency: [x]  Yes, once weekly  Home BP results range: []  Yes    [x]  No, but does say that it is high  Smoking []  Yes [x]  No SOB? []  Yes    [x]  No Chest Pain?: []  Yes    [x]  No Leg swelling?: []  Yes    [x]  No Headaches?: []  Yes    [x]  No Dizziness? []  Yes    [x]  No   2. RASH FOLLOW-UP: 06/30/2020: Visit at [facility]. Patient with vasculitic rash and screened blood work for tickborne illness, CBC, and CMP obtained. Prednisone prescribed.   07/10/2020: Visit at [facility]. Patient with cellulitis of neck and cellulitis of left lower leg. Prescribed Cephalexin and Sulfamethoxazole-Trimethoprim.   07/22/2020: Location: bilateral lower legs for at least 2 months. Reports has taken full course of all previously prescribed medications. Reports severity fluctuates as some days are worse than others. Does itch and burn sometimes. Denies recent changes in detergents, soaps, and cologne.    ROS per HPI   Health Maintenance:  - Declines flu vaccine today. Health Maintenance Due  Topic Date Due  . Hepatitis C Screening  Never done  . HIV Screening  Never done  . COLONOSCOPY (Pts 45-30yrs Insurance coverage will need to be confirmed)  Never done    Past Medical  History: There are no problems to display for this patient.   Social History   reports that he is a non-smoker but has been exposed to tobacco smoke. He has never used smokeless tobacco. He reports current alcohol use of about 2.0 standard drinks of alcohol per week. He reports previous drug use.   Family History  family history includes Diabetes in his mother and sister; Heart disease in his sister; Hyperlipidemia in his mother and sister.   Medications: reviewed and updated   Objective:   Physical Exam BP (!) 159/127 (BP Location: Right Arm, Patient Position: Sitting)   Pulse (!) 109   Ht 5' 9.65" (1.769 m)   Wt 242 lb (109.8 kg)   SpO2 94%   BMI 35.08 kg/m  Physical Exam Constitutional:      Appearance: He is obese.  HENT:     Head: Normocephalic and atraumatic.  Eyes:     Extraocular Movements: Extraocular movements intact.     Pupils: Pupils are equal, round, and reactive to light.  Cardiovascular:     Rate and Rhythm: Tachycardia present.     Pulses: Normal pulses.     Heart sounds: Normal heart sounds.  Pulmonary:     Effort: Pulmonary effort is normal.     Breath sounds: Normal breath sounds.  Musculoskeletal:     Cervical back: Normal range of motion and neck  supple.  Skin:    Findings: Erythema, lesion and rash present. Rash is macular and papular.     Comments: Papular lesion located at the base of the posterior neck presentation similar to possible folliculitis, patient reports this is recurring. Macular erythematous rash on bilateral lower extremities.  Neurological:     General: No focal deficit present.     Mental Status: He is alert and oriented to person, place, and time.  Psychiatric:        Mood and Affect: Mood normal.        Behavior: Behavior normal.      RIGHT LOWER EXTREMITY    LEFT LOWER EXTREMITY   Assessment & Plan:  1. Encounter to establish care: - Patient presents today to establish care.  - Return in 4 to 6 weeks or sooner if  needed for annual physical examination, labs, and health maintenance. Arrive fasting meaning having had no food and/or nothing to drink for at least 8 hours prior to appointment.  2. Essential hypertension: - Blood pressure not at goal during today's visit. Patient asymptomatic without chest pressure, chest pain, palpitations, and shortness of breath. - Will administer one dose of Clonidine today while in clinic. Did offer patient a second dose but patient declined. - Continue Amlodipine as prescribed.  - Adding low-dose Losartan for further management of high blood pressure.  - Follow-up with in 2 weeks with clinical pharmacist for blood pressure check. Write down your blood pressure readings each day and bring those results along with your home blood pressure monitor to your appointment. Medications may be adjusted at that time if needed. - Last CMP obtained 06/30/2020. - BMP will be obtained during same appointment in 2 weeks for blood pressure check. - Counseled on blood pressure goal of less than 130/80, low-sodium, DASH diet, medication compliance, 150 minutes of moderate intensity exercise per week as tolerated. Discussed medication compliance, adverse effects. - Referral to [facility] for further evaluation and management.  - Follow-up with primary provider as scheduled. - Basic Metabolic Panel; Future - amLODipine (NORVASC) 5 MG tablet; Take 1 tablet (5 mg total) by mouth daily.  Dispense: 90 tablet; Refill: 0 - losartan (COZAAR) 25 MG tablet; Take 1 tablet (25 mg total) by mouth daily.  Dispense: 30 tablet; Refill: 0 - cloNIDine (CATAPRES) tablet 0.1 mg - Ambulatory referral to [facility] - [facility] [address]  3. Contact dermatitis, unspecified contact dermatitis type, unspecified trigger: - Patient with ongoing contact dermatitis of bilateral lower extremities. Differentials have included vasculitis and cellulitis. - Recently prescribed Prednisone,  Cephalexin and Sulfamethoxazole-Trimethoprim without success.  - Referral to Dermatology for further evaluation and management. - Ambulatory referral to Dermatology  4. Bumps on skin: - Ongoing papule at base of posterior neck, denies pain. Patient has tried prescribed antibiotics recently without success. Reports this is recurring.  - Referral to Dermatology for further evaluation and management. - Ambulatory referral to Dermatology  5. Influenza vaccine refused: - Declined influenza vaccine during today's visit.    Ricky Stabs, NP 07/22/2020, 1:31 PM Primary Care at [facility]

## 2020-07-22 NOTE — Patient Instructions (Addendum)
Return in 4 to 6 weeks or sooner if needed for annual physical examination, labs, and health maintenance. Arrive fasting meaning having had no food and/or nothing to drink for at least 8 hours prior to appointment. Prednisone for bilateral leg vasculitis and referral to Dermatology.   Continue Amlodipine for high blood pressure. Adding Losartan for high blood pressure. Follow-up in 2 weeks or sooner if needed for blood pressure check at [address] [place], [address]. Thank you for choosing Primary Care at [facility] for your medical home!    Joseph Berkshire was seen by Rema Fendt, NP today.   Curtis Washington's primary care provider is Rema Fendt, NP.   For the best care possible,  you should try to see Ricky Stabs, NP whenever you come to clinic.   We look forward to seeing you again soon!  If you have any questions about your visit today,  please call us at [phone]  Or feel free to reach your provider via MyChart.   Hypertension, Adult Hypertension is another name for high blood pressure. High blood pressure forces your heart to work harder to pump blood. This can cause problems over time. There are two numbers in a blood pressure reading. There is a top number (systolic) over a bottom number (diastolic). It is best to have a blood pressure that is below 120/80. Healthy choices can help lower your blood pressure, or you may need medicine to help lower it. What are the causes? The cause of this condition is not known. Some conditions may be related to high blood pressure. What increases the risk?  Smoking.  Having type 2 diabetes mellitus, high cholesterol, or both.  Not getting enough exercise or physical activity.  Being overweight.  Having too much fat, sugar, calories, or salt (sodium) in your diet.  Drinking too much alcohol.  Having long-term (chronic) kidney disease.  Having a family history of high blood pressure.  Age. Risk increases with  age.  Race. You may be at higher risk if you are African American.  Gender. Men are at higher risk than women before age 70. After age 37, women are at higher risk than men.  Having obstructive sleep apnea.  Stress. What are the signs or symptoms?  High blood pressure may not cause symptoms. Very high blood pressure (hypertensive crisis) may cause: ? Headache. ? Feelings of worry or nervousness (anxiety). ? Shortness of breath. ? Nosebleed. ? A feeling of being sick to your stomach (nausea). ? Throwing up (vomiting). ? Changes in how you see. ? Very bad chest pain. ? Seizures. How is this treated?  This condition is treated by making healthy lifestyle changes, such as: ? Eating healthy foods. ? Exercising more. ? Drinking less alcohol.  Your health care provider may prescribe medicine if lifestyle changes are not enough to get your blood pressure under control, and if: ? Your top number is above 130. ? Your bottom number is above 80.  Your personal target blood pressure may vary. Follow these instructions at home: Eating and drinking   If told, follow the DASH eating plan. To follow this plan: ? Fill one half of your plate at each meal with fruits and vegetables. ? Fill one fourth of your plate at each meal with whole grains. Whole grains include whole-wheat pasta, brown rice, and whole-grain bread. ? Eat or drink low-fat dairy products, such as skim milk or low-fat yogurt. ? Fill one fourth of your  plate at each meal with low-fat (lean) proteins. Low-fat proteins include fish, chicken without skin, eggs, beans, and tofu. ? Avoid fatty meat, cured and processed meat, or chicken with skin. ? Avoid pre-made or processed food.  Eat less than 1,500 mg of salt each day.  Do not drink alcohol if: ? Your doctor tells you not to drink. ? You are pregnant, may be pregnant, or are planning to become pregnant.  If you drink alcohol: ? Limit how much you use to:  0-1 drink a  day for women.  0-2 drinks a day for men. ? Be aware of how much alcohol is in your drink. In the [place] one drink equals one 12 oz bottle of beer (355 mL), one 5 oz glass of wine (148 mL), or one 1 oz glass of hard liquor (44 mL). Lifestyle   Work with your doctor to stay at a healthy weight or to lose weight. Ask your doctor what the best weight is for you.  Get at least 30 minutes of exercise most days of the week. This may include walking, swimming, or biking.  Get at least 30 minutes of exercise that strengthens your muscles (resistance exercise) at least 3 days a week. This may include lifting weights or doing Pilates.  Do not use any products that contain nicotine or tobacco, such as cigarettes, e-cigarettes, and chewing tobacco. If you need help quitting, ask your doctor.  Check your blood pressure at home as told by your doctor.  Keep all follow-up visits as told by your doctor. This is important. Medicines  Take over-the-counter and prescription medicines only as told by your doctor. Follow directions carefully.  Do not skip doses of blood pressure medicine. The medicine does not work as well if you skip doses. Skipping doses also puts you at risk for problems.  Ask your doctor about side effects or reactions to medicines that you should watch for. Contact a doctor if you:  Think you are having a reaction to the medicine you are taking.  Have headaches that keep coming back (recurring).  Feel dizzy.  Have swelling in your ankles.  Have trouble with your vision. Get help right away if you:  Get a very bad headache.  Start to feel mixed up (confused).  Feel weak or numb.  Feel faint.  Have very bad pain in your: ? Chest. ? Belly (abdomen).  Throw up more than once.  Have trouble breathing. Summary  Hypertension is another name for high blood pressure.  High blood pressure forces your heart to work harder to pump blood.  For most people, a normal  blood pressure is less than 120/80.  Making healthy choices can help lower blood pressure. If your blood pressure does not get lower with healthy choices, you may need to take medicine. This information is not intended to replace advice given to you by your health care provider. Make sure you discuss any questions you have with your health care provider. Document Revised: 03/22/2018 Document Reviewed: 03/22/2018 Elsevier Patient Education  2020 [employer].

## 2020-08-13 ENCOUNTER — Encounter: Payer: Self-pay | Admitting: Family

## 2020-08-13 ENCOUNTER — Other Ambulatory Visit: Payer: Self-pay | Admitting: Family

## 2020-08-13 DIAGNOSIS — L02811 Cutaneous abscess of head [any part, except face]: Secondary | ICD-10-CM

## 2020-08-13 DIAGNOSIS — I10 Essential (primary) hypertension: Secondary | ICD-10-CM

## 2020-08-15 MED ORDER — DOXYCYCLINE HYCLATE 100 MG PO TABS: 100.0000 mg | ORAL_TABLET | Freq: Two times a day (BID) | ORAL | 0 refills | Status: DC

## 2020-09-01 ENCOUNTER — Ambulatory Visit (INDEPENDENT_AMBULATORY_CARE_PROVIDER_SITE_OTHER): Payer: Managed Care, Other (non HMO) | Admitting: Cardiovascular Disease

## 2020-09-01 ENCOUNTER — Encounter: Payer: Self-pay | Admitting: *Deleted

## 2020-09-01 ENCOUNTER — Other Ambulatory Visit: Payer: Self-pay

## 2020-09-01 ENCOUNTER — Encounter: Payer: Self-pay | Admitting: Cardiovascular Disease

## 2020-09-01 VITALS — BP 190/104 | HR 111 | Ht 70.0 in | Wt 246.0 lb

## 2020-09-01 DIAGNOSIS — I10 Essential (primary) hypertension: Secondary | ICD-10-CM

## 2020-09-01 MED ORDER — CHLORTHALIDONE 25 MG PO TABS: 25.0000 mg | ORAL_TABLET | Freq: Every day | ORAL | 3 refills | Status: DC

## 2020-09-01 MED ORDER — AMLODIPINE BESYLATE 10 MG PO TABS: 10.0000 mg | ORAL_TABLET | Freq: Every day | ORAL | 3 refills | Status: DC

## 2020-09-01 NOTE — Patient Instructions (Addendum)
Medication Instructions:  STOP LOSARTAN   INCREASE AMLODIPINE TO 10 MG DAILY   START CHLORTHALIDONE 25 MG DAILY    Labwork: BMET IN 1 WEEK    Testing/Procedures: NONE    Follow-Up: 10/29/2020 AT 3:00 PM    Special Instructions:   MONITOR BLOOD PRESSURE TWICE A DAY WITH VIVIFY MONITOR GIVEN TODAY   AMY WILL BE IN TOUCH WITH YOU REGARDING STRESS MANAGEMENT    DASH Eating Plan DASH stands for "Dietary Approaches to Stop Hypertension." The DASH eating plan is a healthy eating plan that has been shown to reduce high blood pressure (hypertension). It may also reduce your risk for type 2 diabetes, heart disease, and stroke. The DASH eating plan may also help with weight loss. What are tips for following this plan?  General guidelines  Avoid eating more than 2,300 mg (milligrams) of salt (sodium) a day. If you have hypertension, you may need to reduce your sodium intake to 1,500 mg a day.  Limit alcohol intake to no more than 1 drink a day for nonpregnant women and 2 drinks a day for men. One drink equals 12 oz of beer, 5 oz of wine, or 1 oz of hard liquor.  Work with your health care provider to maintain a healthy body weight or to lose weight. Ask what an ideal weight is for you.  Get at least 30 minutes of exercise that causes your heart to beat faster (aerobic exercise) most days of the week. Activities may include walking, swimming, or biking.  Work with your health care provider or diet and nutrition specialist (dietitian) to adjust your eating plan to your individual calorie needs. Reading food labels   Check food labels for the amount of sodium per serving. Choose foods with less than 5 percent of the Daily Value of sodium. Generally, foods with less than 300 mg of sodium per serving fit into this eating plan.  To find whole grains, look for the word "whole" as the first word in the ingredient list. Shopping  Buy products labeled as "low-sodium" or "no salt added."  Buy  fresh foods. Avoid canned foods and premade or frozen meals. Cooking  Avoid adding salt when cooking. Use salt-free seasonings or herbs instead of table salt or sea salt. Check with your health care provider or pharmacist before using salt substitutes.  Do not fry foods. Cook foods using healthy methods such as baking, boiling, grilling, and broiling instead.  Cook with heart-healthy oils, such as olive, canola, soybean, or sunflower oil. Meal planning  Eat a balanced diet that includes: ? 5 or more servings of fruits and vegetables each day. At each meal, try to fill half of your plate with fruits and vegetables. ? Up to 6-8 servings of whole grains each day. ? Less than 6 oz of lean meat, poultry, or fish each day. A 3-oz serving of meat is about the same size as a deck of cards. One egg equals 1 oz. ? 2 servings of low-fat dairy each day. ? A serving of nuts, seeds, or beans 5 times each week. ? Heart-healthy fats. Healthy fats called Omega-3 fatty acids are found in foods such as flaxseeds and coldwater fish, like sardines, salmon, and mackerel.  Limit how much you eat of the following: ? Canned or prepackaged foods. ? Food that is high in trans fat, such as fried foods. ? Food that is high in saturated fat, such as fatty meat. ? Sweets, desserts, sugary drinks, and other foods with  added sugar. ? Full-fat dairy products.  Do not salt foods before eating.  Try to eat at least 2 vegetarian meals each week.  Eat more home-cooked food and less restaurant, buffet, and fast food.  When eating at a restaurant, ask that your food be prepared with less salt or no salt, if possible. What foods are recommended? The items listed may not be a complete list. Talk with your dietitian about what dietary choices are best for you. Grains Whole-grain or whole-wheat bread. Whole-grain or whole-wheat pasta. Brown rice. Oatmeal. Quinoa. Bulgur. Whole-grain and low-sodium cereals. Pita bread.  Low-fat, low-sodium crackers. Whole-wheat flour tortillas. Vegetables Fresh or frozen vegetables (raw, steamed, roasted, or grilled). Low-sodium or reduced-sodium tomato and vegetable juice. Low-sodium or reduced-sodium tomato sauce and tomato paste. Low-sodium or reduced-sodium canned vegetables. Fruits All fresh, dried, or frozen fruit. Canned fruit in natural juice (without added sugar). Meat and other protein foods Skinless chicken or turkey. Ground chicken or turkey. Pork with fat trimmed off. Fish and seafood. Egg whites. Dried beans, peas, or lentils. Unsalted nuts, nut butters, and seeds. Unsalted canned beans. Lean cuts of beef with fat trimmed off. Low-sodium, lean deli meat. Dairy Low-fat (1%) or fat-free (skim) milk. Fat-free, low-fat, or reduced-fat cheeses. Nonfat, low-sodium ricotta or cottage cheese. Low-fat or nonfat yogurt. Low-fat, low-sodium cheese. Fats and oils Soft margarine without trans fats. Vegetable oil. Low-fat, reduced-fat, or light mayonnaise and salad dressings (reduced-sodium). Canola, safflower, olive, soybean, and sunflower oils. Avocado. Seasoning and other foods Herbs. Spices. Seasoning mixes without salt. Unsalted popcorn and pretzels. Fat-free sweets. What foods are not recommended? The items listed may not be a complete list. Talk with your dietitian about what dietary choices are best for you. Grains Baked goods made with fat, such as croissants, muffins, or some breads. Dry pasta or rice meal packs. Vegetables Creamed or fried vegetables. Vegetables in a cheese sauce. Regular canned vegetables (not low-sodium or reduced-sodium). Regular canned tomato sauce and paste (not low-sodium or reduced-sodium). Regular tomato and vegetable juice (not low-sodium or reduced-sodium). Pickles. Olives. Fruits Canned fruit in a light or heavy syrup. Fried fruit. Fruit in cream or butter sauce. Meat and other protein foods Fatty cuts of meat. Ribs. Fried meat. Bacon.  Sausage. Bologna and other processed lunch meats. Salami. Fatback. Hotdogs. Bratwurst. Salted nuts and seeds. Canned beans with added salt. Canned or smoked fish. Whole eggs or egg yolks. Chicken or turkey with skin. Dairy Whole or 2% milk, cream, and half-and-half. Whole or full-fat cream cheese. Whole-fat or sweetened yogurt. Full-fat cheese. Nondairy creamers. Whipped toppings. Processed cheese and cheese spreads. Fats and oils Butter. Stick margarine. Lard. Shortening. Ghee. Bacon fat. Tropical oils, such as coconut, palm kernel, or palm oil. Seasoning and other foods Salted popcorn and pretzels. Onion salt, garlic salt, seasoned salt, table salt, and sea salt. Worcestershire sauce. Tartar sauce. Barbecue sauce. Teriyaki sauce. Soy sauce, including reduced-sodium. Steak sauce. Canned and packaged gravies. Fish sauce. Oyster sauce. Cocktail sauce. Horseradish that you find on the shelf. Ketchup. Mustard. Meat flavorings and tenderizers. Bouillon cubes. Hot sauce and Tabasco sauce. Premade or packaged marinades. Premade or packaged taco seasonings. Relishes. Regular salad dressings. Where to find more information:  National Heart, Lung, and Blood Institute: www.nhlbi.nih.gov  American Heart Association: www.heart.org Summary  The DASH eating plan is a healthy eating plan that has been shown to reduce high blood pressure (hypertension). It may also reduce your risk for type 2 diabetes, heart disease, and stroke.  With the DASH   eating plan, you should limit salt (sodium) intake to 2,300 mg a day. If you have hypertension, you may need to reduce your sodium intake to 1,500 mg a day.  When on the DASH eating plan, aim to eat more fresh fruits and vegetables, whole grains, lean proteins, low-fat dairy, and heart-healthy fats.  Work with your health care provider or diet and nutrition specialist (dietitian) to adjust your eating plan to your individual calorie needs. This information is not intended  to replace advice given to you by your health care provider. Make sure you discuss any questions you have with your health care provider. Document Released: 07/01/2011 Document Revised: 06/24/2017 Document Reviewed: 07/05/2016 Elsevier Patient Education  2020 [employer].

## 2020-09-01 NOTE — Progress Notes (Signed)
Hypertension Clinic Initial Assessment:    Date:  09/08/2020   ID:  Curtis Washington, DOB April 14, 1976, MRN [id number]  PCP:  Rema Fendt, NP  Cardiologist:  No primary care provider on file.  Nephrologist:  Referring MD: Rema Fendt, NP   CC: Hypertension  History of Present Illness:    Curtis Washington is a 45 y.o. male with a hx of hypertension here to establish care in the hypertension clinic.  He thinks he was first diagnosed with hypertension in his early 11s.  He does not check it at home but does check it at work.  He was seen at urgent care for a rash 06/2020.  Thre was concern for vasculitis that was worked up.  The rashes since subsided, but it did take up to 3 months for to go away.  He was last seen by his primary care 12/28 and his BP was 159/127.  On review of his chart his blood pressures have been elevated for most of his visit since 2017.  He was given 1 dose of clonidine in the clinic and his home amlodipine was continued.  Losartan was also added to his regimen and he was referred to the advanced hypertension clinic.  He does not check his blood pressure regularly but when he does check it at work it is typically in the 160s systolic.  He rests for 5 minutes before checking it.  He is otherwise felt well.  He has no chest pain or shortness of breath.  He denies lower extremity edema, orthopnea, or PND.  He builds furniture for living.  He has no problems carrying out his very physically demanding job but does not get any exercise outside of work.  He both cooks at home and eats out.  He does most of the cooking at home.  He notes that he does not add a lot of salt but does use a little.  He does not have any caffeine and rarely drinks alcohol.  He does not use any over-the-counter cold or cough medicines.  He previously used ibuprofen but has not used it in quite a while.  He snores but has no apnea or daytime somnolence.  He has been under a lot of stress lately.   Previous  antihypertensives: Losartan   Past Medical History:  Diagnosis Date  . Essential hypertension 09/08/2020  . Hypertension     History reviewed. No pertinent surgical history.  Current Medications: Current Meds  Medication Sig  . chlorthalidone (HYGROTON) 25 MG tablet Take 1 tablet (25 mg total) by mouth daily.  . [DISCONTINUED] amLODipine (NORVASC) 5 MG tablet Take 1 tablet (5 mg total) by mouth daily.     Allergies:   Patient has no known allergies.   Social History   Socioeconomic History  . Marital status: Divorced    Spouse name: Not on file  . Number of children: Not on file  . Years of education: Not on file  . Highest education level: Not on file  Occupational History  . Not on file  Tobacco Use  . Smoking status: Passive Smoke Exposure - Never Smoker  . Smokeless tobacco: Never Used  Vaping Use  . Vaping Use: Never used  Substance and Sexual Activity  . Alcohol use: Yes    Alcohol/week: 2.0 standard drinks    Types: 2 Standard drinks or equivalent per week    Comment: socially  . Drug use: Not Currently  . Sexual activity: Not Currently  Other Topics Concern  . Not on file  Social History Narrative   ** Merged History Encounter **       Social Determinants of Health   Financial Resource Strain: Not on file  Food Insecurity: Not on file  Transportation Needs: Not on file  Physical Activity: Not on file  Stress: Not on file  Social Connections: Not on file     Family History: The patient's family history includes Alcohol abuse in his father; Cancer in his maternal grandmother and mother; Dementia in his maternal grandfather; Diabetes in his mother and sister; Heart disease in his sister; Hyperlipidemia in his mother and sister; Hypertension in his mother.  ROS:   Please see the history of present illness.    All other systems reviewed and are negative.  EKGs/Labs/Other Studies Reviewed:    EKG:  EKG is ordered today.  The ekg ordered today  demonstrates sinus tachycardia.  Rate 111 bpm.  Recent Labs: 06/30/2020: ALT 31; BUN 12; Creatinine, Ser 0.95; Hemoglobin 15.9; Platelets 272; Potassium 4.1; Sodium 142   Recent Lipid Panel No results found for: CHOL, TRIG, HDL, CHOLHDL, VLDL, LDLCALC, LDLDIRECT  Physical Exam:    VS:  BP (!) 190/104 (BP Location: Right Arm, Patient Position: Sitting, Cuff Size: Large)   Pulse (!) 111   Ht 5\' 10"  (1.778 m)   Wt 246 lb (111.6 kg)   SpO2 99%   BMI 35.30 kg/m  , BMI Body mass index is 35.3 kg/m. GENERAL:  Well appearing HEENT: Pupils equal round and reactive, fundi not visualized, oral mucosa unremarkable NECK:  No jugular venous distention, waveform within normal limits, carotid upstroke brisk and symmetric, no bruits LUNGS:  Clear to auscultation bilaterally HEART:  RRR.  PMI not displaced or sustained,S1 and S2 within normal limits, no S3, no S4, no clicks, no rubs, no murmurs ABD:  Flat, positive bowel sounds normal in frequency in pitch, no bruits, no rebound, no guarding, no midline pulsatile mass, no hepatomegaly, no splenomegaly EXT:  2 plus pulses throughout, no edema, no cyanosis no clubbing SKIN:  No rashes no nodules NEURO:  Cranial nerves II through XII grossly intact, motor grossly intact throughout PSYCH:  Cognitively intact, oriented to person place and time   ASSESSMENT:    1. Essential hypertension     PLAN:    # Essential hypertension: BP remains uncontrolled after adding losartan recently.  We will have him see our Care Guide for stress management.  His BP is higher in the doctor's office than at home.  He likely has white coat hypertension superimposed on essential hypertension.  He was given a handbook on hypertension and the DASH diet.  He will limit sodium to 1500 mg. Given that he is African American and not diabetic, we will stop losartan and increase amlodipine to 10mg .  Start chlorthalidone 25mg  and check BMP in a week.  He is unable to participate in  PREP because of distance.  He will increase exercise to 150 minutes weekly.  He consents to monitoring in the [employer] remote patient monitoring program.    Secondary Causes of Hypertension  Medications/Herbal: OCP, steroids, stimulants, antidepressants, weight loss medication, immune suppressants, NSAIDs, sympathomimetics, alcohol, caffeine, licorice, ginseng, St. John's wort, chemo (none identified)  Sleep Apnea (testing not indicated)  Renal artery stenosis (testing not indicated)  Hyperaldosteronism (testing not indicated)  Hyper/hypothyroidism (testing not indicated)  Pheochromocytoma: (testing not indicated)  Cushing's syndrome: (testing not indicated)  Coarctation of the aorta: BP symmetric  Disposition:    FU with MD/PharmD in 1 month    Medication Adjustments/Labs and Tests Ordered: Current medicines are reviewed at length with the patient today.  Concerns regarding medicines are outlined above.  Orders Placed This Encounter  Procedures  . Basic metabolic panel  . EKG 12-Lead   Meds ordered this encounter  Medications  . chlorthalidone (HYGROTON) 25 MG tablet    Sig: Take 1 tablet (25 mg total) by mouth daily.    Dispense:  90 tablet    Refill:  3  . amLODipine (NORVASC) 10 MG tablet    Sig: Take 1 tablet (10 mg total) by mouth daily.    Dispense:  90 tablet    Refill:  3    D/C 5 MG RX     Signed, Chilton Si, MD  09/08/2020 12:57 PM    [facility]

## 2020-09-02 ENCOUNTER — Encounter: Payer: Self-pay | Admitting: Family

## 2020-09-02 ENCOUNTER — Ambulatory Visit: Payer: Commercial Managed Care - PPO | Admitting: Family

## 2020-09-03 ENCOUNTER — Telehealth: Payer: Self-pay

## 2020-09-03 ENCOUNTER — Ambulatory Visit: Payer: Managed Care, Other (non HMO) | Admitting: Family

## 2020-09-03 DIAGNOSIS — Z0000 Encounter for general adult medical examination without abnormal findings: Secondary | ICD-10-CM

## 2020-09-03 MED ORDER — DOXYCYCLINE HYCLATE 100 MG PO TABS: 100.0000 mg | ORAL_TABLET | Freq: Two times a day (BID) | ORAL | 0 refills | Status: AC

## 2020-09-03 NOTE — Telephone Encounter (Signed)
Please call patient with update.   I will be unable to prescribe 3 months supply of antibiotics for skin concerns.   I will refill for one repeat dose of antibiotics.   Also, I will check with the referral coordinator, Arna Medici, to see if we can get him an appointment with maybe another Dermatology office before May.

## 2020-09-03 NOTE — Addendum Note (Signed)
Addended by: Rema Fendt on: 09/03/2020 05:44 PM   Modules accepted: Orders

## 2020-09-03 NOTE — Telephone Encounter (Signed)
Called patient to discuss health coaching for stress management. Left patient a message to return call to Care Guide at [phone]. 

## 2020-09-04 NOTE — Telephone Encounter (Signed)
Pt is aware of refill request

## 2020-09-08 ENCOUNTER — Encounter: Payer: Self-pay | Admitting: Cardiovascular Disease

## 2020-09-08 DIAGNOSIS — I10 Essential (primary) hypertension: Secondary | ICD-10-CM

## 2020-09-08 HISTORY — DX: Essential (primary) hypertension: I10

## 2020-09-09 ENCOUNTER — Telehealth: Payer: Self-pay | Admitting: Pharmacist Clinician (PhC)/ Clinical Pharmacy Specialist

## 2020-09-09 LAB — BASIC METABOLIC PANEL
BUN/Creatinine Ratio: 17 (ref 9–20)
BUN: 17 mg/dL (ref 6–24)
CO2: 24 mmol/L (ref 20–29)
Calcium: 9.9 mg/dL (ref 8.7–10.2)
Chloride: 100 mmol/L (ref 96–106)
Creatinine, Ser: 0.99 mg/dL (ref 0.76–1.27)
GFR calc Af Amer: 106 mL/min/{1.73_m2} (ref >59)
GFR calc non Af Amer: 92 mL/min/{1.73_m2} (ref >59)
Glucose: 93 mg/dL (ref 65–99)
Potassium: 4 mmol/L (ref 3.5–5.2)
Sodium: 141 mmol/L (ref 134–144)

## 2020-09-09 MED ORDER — CARVEDILOL 12.5 MG PO TABS: 12.5000 mg | ORAL_TABLET | Freq: Two times a day (BID) | ORAL | 3 refills | Status: DC

## 2020-09-09 NOTE — Telephone Encounter (Signed)
Patient BP and HR still elevated 1 week after changes to medication.  HR still consistently > 100.  Will add carvedilol 12.5 mg bid.  Spoke with patient, he is concerned about continued elevated readings and he is agreeable to medication.

## 2020-09-12 ENCOUNTER — Other Ambulatory Visit: Payer: Self-pay

## 2020-09-12 ENCOUNTER — Ambulatory Visit (INDEPENDENT_AMBULATORY_CARE_PROVIDER_SITE_OTHER): Payer: Managed Care, Other (non HMO) | Admitting: Family

## 2020-09-12 ENCOUNTER — Encounter: Payer: Self-pay | Admitting: Family

## 2020-09-12 VITALS — BP 124/85 | HR 86 | Wt 243.8 lb

## 2020-09-12 DIAGNOSIS — L259 Unspecified contact dermatitis, unspecified cause: Secondary | ICD-10-CM

## 2020-09-12 DIAGNOSIS — Z13228 Encounter for screening for other metabolic disorders: Secondary | ICD-10-CM

## 2020-09-12 DIAGNOSIS — Z131 Encounter for screening for diabetes mellitus: Secondary | ICD-10-CM | POA: Diagnosis not present

## 2020-09-12 DIAGNOSIS — L989 Disorder of the skin and subcutaneous tissue, unspecified: Secondary | ICD-10-CM

## 2020-09-12 DIAGNOSIS — Z114 Encounter for screening for human immunodeficiency virus [HIV]: Secondary | ICD-10-CM

## 2020-09-12 DIAGNOSIS — Z13220 Encounter for screening for lipoid disorders: Secondary | ICD-10-CM

## 2020-09-12 DIAGNOSIS — Z0000 Encounter for general adult medical examination without abnormal findings: Secondary | ICD-10-CM

## 2020-09-12 DIAGNOSIS — Z1329 Encounter for screening for other suspected endocrine disorder: Secondary | ICD-10-CM

## 2020-09-12 DIAGNOSIS — Z1159 Encounter for screening for other viral diseases: Secondary | ICD-10-CM

## 2020-09-12 DIAGNOSIS — E119 Type 2 diabetes mellitus without complications: Secondary | ICD-10-CM

## 2020-09-12 DIAGNOSIS — Z130 Encounter for screening for diseases of the blood and blood-forming organs and certain disorders involving the immune mechanism: Secondary | ICD-10-CM

## 2020-09-12 NOTE — Progress Notes (Signed)
Annual exam Back of right knee swollen

## 2020-09-12 NOTE — Progress Notes (Signed)
Patient ID: SHANDELL JALLOW, male    DOB: 04-Sep-1975  MRN: [id number]  CC: Annual Physical Exam   Subjective: Jadien Lehigh is a 45 y.o. male who presents for annual physical exam. His concerns today include:  1. KNEE PAIN:  Located on right posterior knee. Says this began shortly after starting to run for exercise as recommended by his Cardiologist. Denies falls and trauma. Taking over-the-counter Icy Hot and using a heat pack. Declines prescribed pharmacological therapy at this time.   Patient Active Problem List   Diagnosis Date Noted  . Essential hypertension 09/08/2020     Current Outpatient Medications on File Prior to Visit  Medication Sig Dispense Refill  . amLODipine (NORVASC) 10 MG tablet Take 1 tablet (10 mg total) by mouth daily. 90 tablet 3  . carvedilol (COREG) 12.5 MG tablet Take 1 tablet (12.5 mg total) by mouth 2 (two) times daily. 60 tablet 3  . chlorthalidone (HYGROTON) 25 MG tablet Take 1 tablet (25 mg total) by mouth daily. 90 tablet 3  . [DISCONTINUED] amitriptyline (ELAVIL) 50 MG tablet TAKE 1 TABLET (50 MG TOTAL) BY MOUTH AT BEDTIME. 30 tablet 0   No current facility-administered medications on file prior to visit.    No Known Allergies  Social History   Socioeconomic History  . Marital status: Divorced    Spouse name: Not on file  . Number of children: Not on file  . Years of education: Not on file  . Highest education level: Not on file  Occupational History  . Not on file  Tobacco Use  . Smoking status: Passive Smoke Exposure - Never Smoker  . Smokeless tobacco: Never Used  Vaping Use  . Vaping Use: Never used  Substance and Sexual Activity  . Alcohol use: Yes    Alcohol/week: 2.0 standard drinks    Types: 2 Standard drinks or equivalent per week    Comment: socially  . Drug use: Not Currently  . Sexual activity: Not Currently  Other Topics Concern  . Not on file  Social History Narrative   ** Merged History Encounter **       Social  Determinants of Health   Financial Resource Strain: Not on file  Food Insecurity: Not on file  Transportation Needs: Not on file  Physical Activity: Not on file  Stress: Not on file  Social Connections: Not on file  Intimate Partner Violence: Not on file    Family History  Problem Relation Age of Onset  . Diabetes Mother   . Hyperlipidemia Mother   . Hypertension Mother   . Cancer Mother   . Diabetes Sister   . Heart disease Sister   . Hyperlipidemia Sister   . Alcohol abuse Father   . Cancer Maternal Grandmother   . Dementia Maternal Grandfather     No past surgical history on file.  ROS: Review of Systems Negative except as stated above  PHYSICAL EXAM: BP 124/85 (BP Location: Left Arm, Patient Position: Sitting)   Pulse 86   Wt 243 lb 12.8 oz (110.6 kg)   SpO2 96%   BMI 34.98 kg/m    Wt Readings from Last 3 Encounters:  09/12/20 243 lb 12.8 oz (110.6 kg)  09/01/20 246 lb (111.6 kg)  07/22/20 242 lb (109.8 kg)    Physical Exam Constitutional:      Appearance: He is obese.  HENT:     Head: Normocephalic and atraumatic.     Right Ear: Tympanic membrane, ear canal and  external ear normal.     Left Ear: Tympanic membrane, ear canal and external ear normal.     Nose: Nose normal.     Mouth/Throat:     Mouth: Mucous membranes are moist.     Pharynx: Oropharynx is clear.  Eyes:     Extraocular Movements: Extraocular movements intact.     Conjunctiva/sclera: Conjunctivae normal.     Pupils: Pupils are equal, round, and reactive to light.  Cardiovascular:     Rate and Rhythm: Normal rate and regular rhythm.     Pulses: Normal pulses.     Heart sounds: Normal heart sounds.  Pulmonary:     Effort: Pulmonary effort is normal.     Breath sounds: Normal breath sounds.  Abdominal:     General: Bowel sounds are normal.  Genitourinary:    Comments: Patient declined examination.  Musculoskeletal:        General: Normal range of motion.     Cervical back: Normal  range of motion and neck supple.  Skin:    General: Skin is warm and dry.     Capillary Refill: Capillary refill takes less than 2 seconds.     Findings: Erythema and rash present. Rash is papular.     Comments: Papular lesion located at the base of the posterior neck presentation similar to possible folliculitis, patient reports this is recurring. Macular erythematous rash on bilateral lower extremities.   Neurological:     General: No focal deficit present.     Mental Status: He is alert and oriented to person, place, and time.  Psychiatric:        Mood and Affect: Mood normal.        Behavior: Behavior normal.     ASSESSMENT AND PLAN: 1. Annual physical exam: - Counseled on 150 minutes of exercise per week as tolerated, healthy eating (including decreased daily intake of saturated fats, cholesterol, added sugars, sodium), STI prevention, and routine healthcare maintenance.  2. Screening for metabolic disorder: - Last CMP obtained 07/20/2020.  3. Diabetes mellitus screening: - Hemoglobin A1c to screen for pre-diabetes/diabetes. - Hemoglobin A1c  4. Screening cholesterol level: - Lipid panel to screen for high cholesterol.  - Lipid Panel  5. Screening for deficiency anemia: - CBC last obtained 06/30/2020.  6. Thyroid disorder screen: - TSH to check thyroid function.  - TSH  7. Need for hepatitis C screening test: - HCV antibody to screen for hepatitis C.  - HCV Ab w/Rflx to Verification  8. Encounter for screening for HIV: - HIV antibody to screen for human immunodeficiency virus.  - HIV antibody (with reflex)  9. Contact dermatitis, unspecified contact dermatitis type, unspecified trigger: - [facility] 09-17-2020 @ 10:15 AM.  10. Bumps on skin: - Keep appointment scheduled with[facility]ermatology [facility] at [facility] on 09-17-2020 @ 10:15 AM.   Patient was given the opportunity to  ask questions.  Patient verbalized understanding of the plan and was able to repeat key elements of the plan. Patient was given clear instructions to go to Emergency Department or return to medical center if symptoms don't improve, worsen, or new problems develop.The patient verbalized understanding.   Orders Placed This Encounter  Procedures  . Hemoglobin A1c  . TSH  . Lipid Panel  . HCV Ab w/Rflx to Verification  . HIV antibody (with reflex)     Requested Prescriptions    No prescriptions requested or ordered in this encounter  Follow-up with primary provider as needed.   Rema Fendt, NP

## 2020-09-13 LAB — LIPID PANEL
Chol/HDL Ratio: 3.6 ratio (ref 0.0–5.0)
Cholesterol, Total: 131 mg/dL (ref 100–199)
HDL: 36 mg/dL — ABNORMAL LOW (ref >39)
LDL Chol Calc (NIH): 80 mg/dL (ref 0–99)
Triglycerides: 76 mg/dL (ref 0–149)
VLDL Cholesterol Cal: 15 mg/dL (ref 5–40)

## 2020-09-13 LAB — HCV INTERPRETATION

## 2020-09-13 LAB — HEMOGLOBIN A1C
Est. average glucose Bld gHb Est-mCnc: 154 mg/dL
Hgb A1c MFr Bld: 7 % — ABNORMAL HIGH (ref 4.8–5.6)

## 2020-09-13 LAB — TSH: TSH: 0.866 u[IU]/mL (ref 0.450–4.500)

## 2020-09-13 LAB — HCV AB W/RFLX TO VERIFICATION: HCV Ab: <0.1 s/co ratio (ref 0.0–0.9)

## 2020-09-13 LAB — HIV ANTIBODY (ROUTINE TESTING W REFLEX): HIV Screen 4th Generation wRfx: NONREACTIVE

## 2020-09-16 ENCOUNTER — Encounter: Payer: Self-pay | Admitting: Family

## 2020-09-16 DIAGNOSIS — E119 Type 2 diabetes mellitus without complications: Secondary | ICD-10-CM | POA: Insufficient documentation

## 2020-09-16 MED ORDER — GLUCOSE BLOOD VI STRP: ORAL_STRIP | 12 refills | Status: DC

## 2020-09-16 MED ORDER — METFORMIN HCL 500 MG PO TABS: 500.0000 mg | ORAL_TABLET | Freq: Every day | ORAL | 0 refills | Status: DC

## 2020-09-16 MED ORDER — ONETOUCH ULTRA 2 W/DEVICE KIT: 1.0000 | PACK | Freq: Two times a day (BID) | 0 refills | Status: DC

## 2020-09-16 MED ORDER — ONETOUCH DELICA LANCETS 33G MISC: 1.0000 | Freq: Two times a day (BID) | 1 refills | Status: DC

## 2020-09-16 NOTE — Addendum Note (Signed)
Addended by: Rema Fendt on: 09/16/2020 05:47 PM   Modules accepted: Orders

## 2020-09-16 NOTE — Progress Notes (Signed)
Please call patient with update.   Thyroid normal.   Hepatitis C negative.   HIV negative.   LDL, sometimes called "good cholesterol" is lower than expected. This may increase risk of heart attack and/or stroke. Consider eating more fruits, vegetables, and lean baked meats such as chicken or fish. Moderate intensity exercise at least 150 minutes as tolerated per week may help as well.   A1c consistent with diabetes. Metformin 500 mg daily at breakfast prescribed.   The most common side effects of Metformin are diarrhea, nausea, gas, heartburn, vomiting, and abdominal pain. If you should experience these or any symptoms otherwise please notify provider and discontinue use.  Check blood sugars each day with meals and write down results along with the date. Bring these results with you for diabetes checkup in 4 weeks (please scheduled while patient is on the phone). A glucose monitor, lancets, and testing strips prescribed.   A normal fasting blood sugar is 80 - 130. A normal blood sugar two hours after meals is less than 180.

## 2020-09-16 NOTE — Addendum Note (Signed)
Addended by: Rema Fendt on: 09/16/2020 06:00 PM   Modules accepted: Orders

## 2020-09-18 ENCOUNTER — Other Ambulatory Visit: Payer: Self-pay

## 2020-09-18 DIAGNOSIS — E119 Type 2 diabetes mellitus without complications: Secondary | ICD-10-CM

## 2020-09-18 MED ORDER — GLUCOSE BLOOD VI STRP: ORAL_STRIP | 12 refills | Status: DC

## 2020-09-18 NOTE — Progress Notes (Signed)
Glucose blood test strips sig updated

## 2020-09-23 ENCOUNTER — Telehealth: Payer: Self-pay

## 2020-09-23 DIAGNOSIS — Z0000 Encounter for general adult medical examination without abnormal findings: Secondary | ICD-10-CM

## 2020-09-23 NOTE — Telephone Encounter (Signed)
Called patient to discuss health coaching for sleep hygiene and exercise per pathway survey responses in [employer]. Patient stated that he has never gotten much sleep and that he exercises but don't get the recommended amount at the moment. Patient is not interested in health coaching at this time.

## 2020-09-30 DIAGNOSIS — I10 Essential (primary) hypertension: Secondary | ICD-10-CM | POA: Diagnosis not present

## 2020-10-01 ENCOUNTER — Ambulatory Visit: Payer: Managed Care, Other (non HMO)

## 2020-10-29 ENCOUNTER — Ambulatory Visit: Payer: Managed Care, Other (non HMO)

## 2020-11-03 ENCOUNTER — Other Ambulatory Visit: Payer: Self-pay | Admitting: Family

## 2020-11-03 DIAGNOSIS — E119 Type 2 diabetes mellitus without complications: Secondary | ICD-10-CM

## 2020-11-06 MED ORDER — METFORMIN HCL 500 MG PO TABS: 500.0000 mg | ORAL_TABLET | Freq: Every day | ORAL | 0 refills | Status: DC

## 2020-11-25 ENCOUNTER — Encounter: Payer: Self-pay | Admitting: Family

## 2020-11-29 ENCOUNTER — Other Ambulatory Visit: Payer: Self-pay | Admitting: Family

## 2020-11-29 DIAGNOSIS — E119 Type 2 diabetes mellitus without complications: Secondary | ICD-10-CM

## 2020-12-04 ENCOUNTER — Ambulatory Visit: Payer: Managed Care, Other (non HMO)

## 2020-12-11 ENCOUNTER — Ambulatory Visit: Payer: Commercial Managed Care - PPO | Admitting: Physician Assistant

## 2020-12-25 ENCOUNTER — Telehealth: Payer: Self-pay

## 2020-12-25 ENCOUNTER — Telehealth: Payer: Self-pay | Admitting: Pharmacist

## 2020-12-25 DIAGNOSIS — Z0000 Encounter for general adult medical examination without abnormal findings: Secondary | ICD-10-CM

## 2020-12-25 NOTE — Telephone Encounter (Signed)
Called patient because blood pressure readings are missing in [employer]. Patient stated that he has been putting in his readings manually, but they are not showing up in the system. Patient stated that he recently purchased another phone and downloaded the app but it still doesn't work. Care Guide will submit and IT ticket to [employer] to assist the patient. Will follow up with the patient on 12/26/20.

## 2020-12-25 NOTE — Telephone Encounter (Signed)
LMOM, please check BP 2-3 times per week.  Please call back if tech issues

## 2020-12-29 ENCOUNTER — Telehealth: Payer: Self-pay

## 2020-12-29 DIAGNOSIS — Z0000 Encounter for general adult medical examination without abnormal findings: Secondary | ICD-10-CM

## 2020-12-29 NOTE — Telephone Encounter (Signed)
Called patient to determine if he had been contacted by [employer] IT. Patient stated that he has yet to receive a call from them. Care Guide will follow up on IT ticket generated from [employer] on 12/25/20 to determine next steps. Will follow up with patient afterwards.

## 2021-01-09 ENCOUNTER — Other Ambulatory Visit: Payer: Self-pay | Admitting: Cardiovascular Disease

## 2021-01-27 ENCOUNTER — Telehealth: Payer: Self-pay

## 2021-01-27 DIAGNOSIS — Z0000 Encounter for general adult medical examination without abnormal findings: Secondary | ICD-10-CM

## 2021-01-27 NOTE — Telephone Encounter (Signed)
Called patient to follow up on connectivity issue. Left patient a message to return call to Care Guide at [phone].

## 2021-01-28 ENCOUNTER — Ambulatory Visit: Payer: Managed Care, Other (non HMO)

## 2021-01-28 NOTE — Progress Notes (Deleted)
     01/28/2021 Curtis Washington 10-17-1975 [id number]   HPI:  Curtis Washington is a 45 y.o. male patient of Dr Oval Linsey, with a PMH below who presents today for advanced hypertension clinic management.   He was seen by Dr. Oval Linsey back in February, at which time his pressure was noted to bee 190/104, with pulse of 111.  He believes this was first diagnosed 4-5 years ago and chart review shows elevated pressures since 2017.   Dr. Oval Linsey noted that he has some measure of white coat hypertension superimposed on essential hypertension.  She stopped his losartan, increased amlodipine to 10 mg and started chlorthalidone 25 mg daily.   He was given a [employer] home monitor and agreed to use.  After a week on this regimen, he noted that pressures as well as heart rate were still elevated.  Carvedilol 12.5 mg bid was added.  He had stopped using the [employer] app and a ticket was placed to have [facility] work with him to correct any problems, as he stated was entering readings manually.    Today he returns for follow up    Past Medical History:                   Blood Pressure Goal:  130/80  Current Medications:  Family Hx:  Social Hx:   Diet:   Exercise:   Home BP readings:   Intolerances:   Labs:    Wt Readings from Last 3 Encounters:  09/12/20 243 lb 12.8 oz (110.6 kg)  09/01/20 246 lb (111.6 kg)  07/22/20 242 lb (109.8 kg)   BP Readings from Last 3 Encounters:  09/12/20 124/85  09/01/20 (!) 190/104  07/22/20 (!) 159/127   Pulse Readings from Last 3 Encounters:  09/12/20 86  09/01/20 (!) 111  07/22/20 (!) 109    Current Outpatient Medications  Medication Sig Dispense Refill   carvedilol (COREG) 12.5 MG tablet TAKE 1 TABLET BY MOUTH 2 TIMES DAILY. 60 tablet 8   amLODipine (NORVASC) 10 MG tablet Take 1 tablet (10 mg total) by mouth daily. 90 tablet 3   Blood Glucose Monitoring Suppl (ONE TOUCH ULTRA 2) w/Device KIT 1 each by Does not apply route 2 (two) times daily with a meal. 1  kit 0   chlorthalidone (HYGROTON) 25 MG tablet Take 1 tablet (25 mg total) by mouth daily. 90 tablet 3   glucose blood test strip Check blood sugars daily w/meals 100 each 12   metFORMIN (GLUCOPHAGE) 500 MG tablet TAKE 1 TABLET BY MOUTH EVERY DAY WITH BREAKFAST 30 tablet 0   OneTouch Delica Lancets 76P MISC 1 each by Does not apply route 2 (two) times daily with a meal. 100 each 1   No current facility-administered medications for this visit.    No Known Allergies  Past Medical History:  Diagnosis Date   Essential hypertension 09/08/2020   Hypertension     There were no vitals taken for this visit.  No problem-specific Assessment & Plan notes found for this encounter.   Tommy Medal PharmD CPP [facility] [address] [place], [place] [address] [phone]

## 2021-02-27 ENCOUNTER — Ambulatory Visit: Payer: Managed Care, Other (non HMO)

## 2021-09-17 NOTE — Progress Notes (Signed)
Erroneous encounter

## 2021-09-18 ENCOUNTER — Encounter: Payer: Managed Care, Other (non HMO) | Admitting: Family

## 2021-09-18 DIAGNOSIS — E119 Type 2 diabetes mellitus without complications: Secondary | ICD-10-CM

## 2021-10-04 NOTE — Progress Notes (Signed)
Patient ID: Curtis Washington, male    DOB: 02/22/1976  MRN: [id number]  CC: Annual Physical Exam  Subjective: Curtis Washington is a 46 y.o. male who presents for annual physical exam.   His concerns today include:  Reports has not taken any medications since November 2022 due to no refills. Also, needs a new Cardiology referral for blood pressure management as his previous doctor went to a new practice.    Patient Active Problem List   Diagnosis Date Noted   Type 2 diabetes mellitus (HCC) 09/16/2020   Essential hypertension 09/08/2020     Current Outpatient Medications on File Prior to Visit  Medication Sig Dispense Refill   carvedilol (COREG) 12.5 MG tablet TAKE 1 TABLET BY MOUTH 2 TIMES DAILY. 60 tablet 8   amLODipine (NORVASC) 10 MG tablet Take 1 tablet (10 mg total) by mouth daily. 90 tablet 3   Blood Glucose Monitoring Suppl (ONE TOUCH ULTRA 2) w/Device KIT 1 each by Does not apply route 2 (two) times daily with a meal. 1 kit 0   chlorthalidone (HYGROTON) 25 MG tablet Take 1 tablet (25 mg total) by mouth daily. 90 tablet 3   glucose blood test strip Check blood sugars daily w/meals 100 each 12   OneTouch Delica Lancets 33G MISC 1 each by Does not apply route 2 (two) times daily with a meal. 100 each 1   [DISCONTINUED] amitriptyline (ELAVIL) 50 MG tablet TAKE 1 TABLET (50 MG TOTAL) BY MOUTH AT BEDTIME. 30 tablet 0   No current facility-administered medications on file prior to visit.    No Known Allergies  Social History   Socioeconomic History   Marital status: Divorced    Spouse name: Not on file   Number of children: Not on file   Years of education: Not on file   Highest education level: Not on file  Occupational History   Not on file  Tobacco Use   Smoking status: Passive Smoke Exposure - Never Smoker   Smokeless tobacco: Never  Vaping Use   Vaping Use: Never used  Substance and Sexual Activity   Alcohol use: Yes    Alcohol/week: 2.0 standard drinks    Types: 2  Standard drinks or equivalent per week    Comment: socially   Drug use: Not Currently   Sexual activity: Not Currently  Other Topics Concern   Not on file  Social History Narrative   ** Merged History Encounter **       Social Determinants of Health   Financial Resource Strain: Not on file  Food Insecurity: Not on file  Transportation Needs: Not on file  Physical Activity: Not on file  Stress: Not on file  Social Connections: Not on file  Intimate Partner Violence: Not on file    Family History  Problem Relation Age of Onset   Diabetes Mother    Hyperlipidemia Mother    Hypertension Mother    Cancer Mother    Diabetes Sister    Heart disease Sister    Hyperlipidemia Sister    Alcohol abuse Father    Cancer Maternal Grandmother    Dementia Maternal Grandfather     No past surgical history on file.  ROS: Review of Systems Negative except as stated above  PHYSICAL EXAM: BP (!) 157/99 (BP Location: Left Arm, Patient Position: Sitting, Cuff Size: Large)   Pulse 85   Temp 98.3 F (36.8 C)   Resp 18   Ht 5' 9.65" (1.769 m)  Wt 240 lb (108.9 kg)   SpO2 97%   BMI 34.79 kg/m   Physical Exam HENT:     Head: Normocephalic and atraumatic.     Right Ear: Tympanic membrane, ear canal and external ear normal.     Left Ear: Tympanic membrane, ear canal and external ear normal.     Nose: Nose normal.     Mouth/Throat:     Mouth: Mucous membranes are moist.     Pharynx: Oropharynx is clear.  Eyes:     Extraocular Movements: Extraocular movements intact.     Conjunctiva/sclera: Conjunctivae normal.     Pupils: Pupils are equal, round, and reactive to light.  Cardiovascular:     Rate and Rhythm: Normal rate and regular rhythm.     Pulses: Normal pulses.     Heart sounds: Normal heart sounds.  Pulmonary:     Effort: Pulmonary effort is normal.     Breath sounds: Normal breath sounds.  Abdominal:     General: Bowel sounds are normal.     Palpations: Abdomen is  soft.  Genitourinary:    Comments: Patient declined.  Musculoskeletal:        General: Normal range of motion.     Cervical back: Normal range of motion and neck supple.  Skin:    General: Skin is warm and dry.     Capillary Refill: Capillary refill takes less than 2 seconds.  Neurological:     General: No focal deficit present.     Mental Status: He is alert and oriented to person, place, and time.  Psychiatric:        Mood and Affect: Mood normal.        Behavior: Behavior normal.   Diabetic foot exam was performed with the following findings:   No deformities, ulcerations, or other skin breakdown Normal sensation of 10g monofilament Intact posterior tibialis and dorsalis pedis pulses    Results for orders placed or performed in visit on 10/09/21  POCT glycosylated hemoglobin (Hb A1C)  Result Value Ref Range   Hemoglobin A1C 6.6 (A) 4.0 - 5.6 %   HbA1c POC (<> result, manual entry)     HbA1c, POC (prediabetic range)     HbA1c, POC (controlled diabetic range)      ASSESSMENT AND PLAN: 1. Annual physical exam: - Counseled on 150 minutes of exercise per week as tolerated, healthy eating (including decreased daily intake of saturated fats, cholesterol, added sugars, sodium), STI prevention, and routine healthcare maintenance.  2. Screening for metabolic disorder: - CMP14+EGFR to check kidney function, liver function, and electrolyte balance.  - CMP14+EGFR  3. Screening for deficiency anemia: - CBC to screen for anemia. - CBC  4. Screening cholesterol level: - Lipid panel to screen for high cholesterol.  - Lipid panel  5. Thyroid disorder screen: - TSH to check thyroid function.  - TSH  6. Type 2 diabetes mellitus without complication, without long-term current use of insulin (HCC): - Hemoglobin A1c today at goal at 6.6%, goal < 7%.  - Metformin as prescribed. Counseled on medication adherence and adverse reactions.  - Discussed the importance of healthy eating  habits, low-carbohydrate diet, low-sugar diet, regular aerobic exercise (at least 150 minutes a week as tolerated) and medication compliance to achieve or maintain control of diabetes. - Microalbumin / creatinine urine ratio kidney screen. - Follow-up with primary provider in 3 months or sooner if needed.  - Microalbumin / creatinine urine ratio - POCT glycosylated hemoglobin (Hb A1C) -  metFORMIN (GLUCOPHAGE) 500 MG tablet; Take 1 tablet (500 mg total) by mouth daily with breakfast.  Dispense: 90 tablet; Refill: 0  7. Diabetic eye exam [facility]): - Referral to Ophthalmology for further evaluation and management.  - Ambulatory referral to Ophthalmology  8. Encounter for diabetic foot exam [facility]): - Completed today in office.   9. Colon cancer screening: - Patient elects Cologuard for colon cancer screening.  - Cologuard  10. Essential (primary) hypertension: - Referral to Cardiology for further evaluation and management.  - Ambulatory referral to Cardiology   Patient was given the opportunity to ask questions.  Patient verbalized understanding of the plan and was able to repeat key elements of the plan. Patient was given clear instructions to go to Emergency Department or return to medical center if symptoms don't improve, worsen, or new problems develop.The patient verbalized understanding.   Orders Placed This Encounter  Procedures   Microalbumin / creatinine urine ratio   CBC   Lipid panel   TSH   CMP14+EGFR   Cologuard   Ambulatory referral to Ophthalmology   Ambulatory referral to Cardiology   POCT glycosylated hemoglobin (Hb A1C)     Requested Prescriptions   Signed Prescriptions Disp Refills   metFORMIN (GLUCOPHAGE) 500 MG tablet 90 tablet 0    Sig: Take 1 tablet (500 mg total) by mouth daily with breakfast.    Return for Physical per patient preference, Follow-Up or next available 3 months diabetes .  Rema Fendt, NP

## 2021-10-09 ENCOUNTER — Other Ambulatory Visit: Payer: Self-pay

## 2021-10-09 ENCOUNTER — Ambulatory Visit (INDEPENDENT_AMBULATORY_CARE_PROVIDER_SITE_OTHER): Payer: Managed Care, Other (non HMO) | Admitting: Family

## 2021-10-09 VITALS — BP 157/99 | HR 85 | Temp 98.3°F | Resp 18 | Ht 69.65 in | Wt 240.0 lb

## 2021-10-09 DIAGNOSIS — Z0000 Encounter for general adult medical examination without abnormal findings: Secondary | ICD-10-CM

## 2021-10-09 DIAGNOSIS — Z1211 Encounter for screening for malignant neoplasm of colon: Secondary | ICD-10-CM

## 2021-10-09 DIAGNOSIS — Z13228 Encounter for screening for other metabolic disorders: Secondary | ICD-10-CM

## 2021-10-09 DIAGNOSIS — Z130 Encounter for screening for diseases of the blood and blood-forming organs and certain disorders involving the immune mechanism: Secondary | ICD-10-CM

## 2021-10-09 DIAGNOSIS — E119 Type 2 diabetes mellitus without complications: Secondary | ICD-10-CM | POA: Diagnosis not present

## 2021-10-09 DIAGNOSIS — Z13220 Encounter for screening for lipoid disorders: Secondary | ICD-10-CM

## 2021-10-09 DIAGNOSIS — Z1329 Encounter for screening for other suspected endocrine disorder: Secondary | ICD-10-CM

## 2021-10-09 DIAGNOSIS — Z0100 Encounter for examination of eyes and vision without abnormal findings: Secondary | ICD-10-CM

## 2021-10-09 DIAGNOSIS — I10 Essential (primary) hypertension: Secondary | ICD-10-CM

## 2021-10-09 LAB — POCT GLYCOSYLATED HEMOGLOBIN (HGB A1C): Hemoglobin A1C: 6.6 % — AB (ref 4.0–5.6)

## 2021-10-09 MED ORDER — METFORMIN HCL 500 MG PO TABS
500.0000 mg | ORAL_TABLET | Freq: Every day | ORAL | 0 refills | Status: DC
Start: 2021-10-09 — End: 2021-12-14

## 2021-10-09 NOTE — Progress Notes (Signed)
Pt presents for annual physical exam, pt has not taken any medications since November of last year  ?Pt request to start w/Cologuard instead of Colonoscopy  ?

## 2021-10-09 NOTE — Progress Notes (Signed)
Diabetes discussed in office.

## 2021-10-10 LAB — CMP14+EGFR
ALT: 29 IU/L (ref 0–44)
AST: 20 IU/L (ref 0–40)
Albumin/Globulin Ratio: 1.3 (ref 1.2–2.2)
Albumin: 4.7 g/dL (ref 4.0–5.0)
Alkaline Phosphatase: 72 IU/L (ref 44–121)
BUN/Creatinine Ratio: 12 (ref 9–20)
BUN: 12 mg/dL (ref 6–24)
Bilirubin Total: 0.6 mg/dL (ref 0.0–1.2)
CO2: 20 mmol/L (ref 20–29)
Calcium: 10.2 mg/dL (ref 8.7–10.2)
Chloride: 104 mmol/L (ref 96–106)
Creatinine, Ser: 0.97 mg/dL (ref 0.76–1.27)
Globulin, Total: 3.5 g/dL (ref 1.5–4.5)
Glucose: 96 mg/dL (ref 70–99)
Potassium: 4.7 mmol/L (ref 3.5–5.2)
Sodium: 145 mmol/L — ABNORMAL HIGH (ref 134–144)
Total Protein: 8.2 g/dL (ref 6.0–8.5)
eGFR: 98 mL/min/{1.73_m2} (ref >59)

## 2021-10-10 LAB — CBC
Hematocrit: 49.9 % (ref 37.5–51.0)
Hemoglobin: 16.5 g/dL (ref 13.0–17.7)
MCH: 26.1 pg — ABNORMAL LOW (ref 26.6–33.0)
MCHC: 33.1 g/dL (ref 31.5–35.7)
MCV: 79 fL (ref 79–97)
Platelets: 270 10*3/uL (ref 150–450)
RBC: 6.33 x10E6/uL — ABNORMAL HIGH (ref 4.14–5.80)
RDW: 14.5 % (ref 11.6–15.4)
WBC: 8.9 10*3/uL (ref 3.4–10.8)

## 2021-10-10 LAB — LIPID PANEL
Chol/HDL Ratio: 3.6 ratio (ref 0.0–5.0)
Cholesterol, Total: 135 mg/dL (ref 100–199)
HDL: 38 mg/dL — ABNORMAL LOW (ref >39)
LDL Chol Calc (NIH): 82 mg/dL (ref 0–99)
Triglycerides: 75 mg/dL (ref 0–149)
VLDL Cholesterol Cal: 15 mg/dL (ref 5–40)

## 2021-10-10 LAB — MICROALBUMIN / CREATININE URINE RATIO
Creatinine, Urine: 101.2 mg/dL
Microalb/Creat Ratio: 26 mg/g creat (ref 0–29)
Microalbumin, Urine: 26.5 ug/mL

## 2021-10-10 LAB — TSH: TSH: 0.549 u[IU]/mL (ref 0.450–4.500)

## 2021-10-11 ENCOUNTER — Other Ambulatory Visit: Payer: Self-pay | Admitting: Family

## 2021-10-11 DIAGNOSIS — E785 Hyperlipidemia, unspecified: Secondary | ICD-10-CM

## 2021-10-11 DIAGNOSIS — D751 Secondary polycythemia: Secondary | ICD-10-CM

## 2021-10-11 MED ORDER — ATORVASTATIN CALCIUM 20 MG PO TABS: 20.0000 mg | ORAL_TABLET | Freq: Every day | ORAL | 0 refills | Status: DC

## 2021-10-11 NOTE — Progress Notes (Signed)
Kidney function normal.  ? ?Liver function normal. ? ?Thyroid function normal ? ?Microalbumin creatinine urine ratio shows there is no significant amount of protein in the urine.  ? ?No anemia. However, red blood cells remaining higher than normal for several years. Referral to Hematology Oncology for further evaluation and management. Their office should call patient within 2 weeks with appointment details.  ? ?HDL cholesterol (sometimes called "good cholesterol") lower than expected. High cholesterol may increase risk of heart attack and/or stroke. Consider eating more fruits, vegetables, and lean baked meats such as chicken or fish. Moderate intensity exercise at least 150 minutes as tolerated per week may help as well.  ? ?Begin Atorvastatin as prescribed for cholesterol. Encouraged to recheck fasting cholesterol in 4 to 6 weeks. ? ?The following is for provider reference only: ?The 10-year ASCVD risk score (Arnett DK, et al., 2019) is: 18.4% ?  Values used to calculate the score: ?    Age: 46 years ?    Sex: Male ?    Is Non-Hispanic African American: Yes ?    Diabetic: Yes ?    Tobacco smoker: No ?    Systolic Blood Pressure: 157 mmHg ?    Is BP treated: Yes ?    HDL Cholesterol: 38 mg/dL ?    Total Cholesterol: 135 mg/dL ? ?

## 2021-10-12 ENCOUNTER — Telehealth: Payer: Self-pay | Admitting: Hematology and Oncology

## 2021-10-12 NOTE — Telephone Encounter (Signed)
Scheduled appt per 3/19 referral. Pt is aware of appt date and time. Pt is aware to arrive 15 mins prior to appt time and to bring and updated insurance card. Pt is aware of appt location.   ?

## 2021-10-19 ENCOUNTER — Encounter (HOSPITAL_COMMUNITY): Payer: Self-pay

## 2021-10-19 ENCOUNTER — Emergency Department (HOSPITAL_COMMUNITY): Payer: Managed Care, Other (non HMO)

## 2021-10-19 ENCOUNTER — Other Ambulatory Visit: Payer: Self-pay

## 2021-10-19 ENCOUNTER — Emergency Department (HOSPITAL_COMMUNITY)
Admission: EM | Admit: 2021-10-19 | Discharge: 2021-10-20 | Disposition: A | Discharge status: Home / Self Care | Payer: Managed Care, Other (non HMO) | Attending: Emergency Medicine | Admitting: Emergency Medicine

## 2021-10-19 DIAGNOSIS — E119 Type 2 diabetes mellitus without complications: Secondary | ICD-10-CM | POA: Diagnosis not present

## 2021-10-19 DIAGNOSIS — Y92410 Unspecified street and highway as the place of occurrence of the external cause: Secondary | ICD-10-CM | POA: Insufficient documentation

## 2021-10-19 DIAGNOSIS — R0789 Other chest pain: Secondary | ICD-10-CM

## 2021-10-19 DIAGNOSIS — S299XXA Unspecified injury of thorax, initial encounter: Secondary | ICD-10-CM | POA: Diagnosis not present

## 2021-10-19 DIAGNOSIS — Z7984 Long term (current) use of oral hypoglycemic drugs: Secondary | ICD-10-CM | POA: Insufficient documentation

## 2021-10-19 DIAGNOSIS — S50812A Abrasion of left forearm, initial encounter: Secondary | ICD-10-CM | POA: Insufficient documentation

## 2021-10-19 DIAGNOSIS — I10 Essential (primary) hypertension: Secondary | ICD-10-CM | POA: Insufficient documentation

## 2021-10-19 DIAGNOSIS — S50811A Abrasion of right forearm, initial encounter: Secondary | ICD-10-CM | POA: Diagnosis not present

## 2021-10-19 DIAGNOSIS — Z79899 Other long term (current) drug therapy: Secondary | ICD-10-CM | POA: Diagnosis not present

## 2021-10-19 DIAGNOSIS — S59911A Unspecified injury of right forearm, initial encounter: Secondary | ICD-10-CM | POA: Diagnosis present

## 2021-10-19 MED ORDER — AMLODIPINE BESYLATE 5 MG PO TABS
10.0000 mg | ORAL_TABLET | Freq: Once | ORAL | Status: AC
  Administered 2021-10-19: 10 mg via ORAL
  Filled 2021-10-19: qty 2
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
## 2021-10-20 MED ORDER — AMLODIPINE BESYLATE 10 MG PO TABS: 10.0000 mg | ORAL_TABLET | Freq: Every day | ORAL | 0 refills | Status: DC
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
## 2021-10-22 ENCOUNTER — Encounter: Payer: Self-pay | Admitting: Family

## 2021-10-22 ENCOUNTER — Telehealth: Payer: Self-pay | Admitting: Hematology and Oncology

## 2021-10-22 NOTE — Telephone Encounter (Signed)
R/s new hem appt per pt request. Pt is aware of new appt date and time.  ?

## 2021-10-23 ENCOUNTER — Other Ambulatory Visit: Payer: Managed Care, Other (non HMO)

## 2021-10-23 ENCOUNTER — Encounter: Payer: Managed Care, Other (non HMO) | Admitting: Hematology and Oncology
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
## 2021-10-26 ENCOUNTER — Ambulatory Visit (INDEPENDENT_AMBULATORY_CARE_PROVIDER_SITE_OTHER): Payer: Managed Care, Other (non HMO)

## 2021-10-26 ENCOUNTER — Ambulatory Visit: Payer: Managed Care, Other (non HMO) | Admitting: Family

## 2021-10-26 ENCOUNTER — Encounter: Payer: Self-pay | Admitting: Family

## 2021-10-26 VITALS — BP 128/84 | HR 78 | Temp 98.3°F | Resp 18 | Ht 69.65 in | Wt 240.0 lb

## 2021-10-26 DIAGNOSIS — I10 Essential (primary) hypertension: Secondary | ICD-10-CM

## 2021-10-26 DIAGNOSIS — R0789 Other chest pain: Secondary | ICD-10-CM

## 2021-10-26 DIAGNOSIS — M25511 Pain in right shoulder: Secondary | ICD-10-CM | POA: Diagnosis not present

## 2021-10-26 DIAGNOSIS — M25512 Pain in left shoulder: Secondary | ICD-10-CM | POA: Diagnosis not present

## 2021-10-26 DIAGNOSIS — Z09 Encounter for follow-up examination after completed treatment for conditions other than malignant neoplasm: Secondary | ICD-10-CM
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
## 2021-10-27 ENCOUNTER — Other Ambulatory Visit: Payer: Self-pay | Admitting: Family

## 2021-10-27 ENCOUNTER — Encounter: Payer: Self-pay | Admitting: Family

## 2021-10-27 NOTE — Progress Notes (Signed)
Mild arthritis left elbow. No fracture or dislocation left shoulder.

## 2021-10-27 NOTE — Progress Notes (Signed)
Mild arthritis. No fracture or dislocation right shoulder.

## 2021-10-28 ENCOUNTER — Encounter: Payer: Self-pay | Admitting: Family
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
## 2021-11-03 ENCOUNTER — Ambulatory Visit: Payer: Managed Care, Other (non HMO) | Admitting: Family

## 2021-11-03 ENCOUNTER — Encounter: Payer: Self-pay | Admitting: Family

## 2021-11-03 VITALS — BP 114/71 | HR 103 | Temp 98.3°F | Resp 18 | Ht 69.65 in | Wt 240.0 lb

## 2021-11-03 DIAGNOSIS — I10 Essential (primary) hypertension: Secondary | ICD-10-CM

## 2021-11-03 DIAGNOSIS — M25512 Pain in left shoulder: Secondary | ICD-10-CM | POA: Diagnosis not present

## 2021-11-03 DIAGNOSIS — F411 Generalized anxiety disorder: Secondary | ICD-10-CM

## 2021-11-03 DIAGNOSIS — M25511 Pain in right shoulder: Secondary | ICD-10-CM

## 2021-11-03 DIAGNOSIS — Z0289 Encounter for other administrative examinations: Secondary | ICD-10-CM

## 2021-11-03 MED ORDER — HYDROXYZINE PAMOATE 25 MG PO CAPS: 25.0000 mg | ORAL_CAPSULE | Freq: Three times a day (TID) | ORAL | 0 refills | Status: DC | PRN

## 2021-11-03 MED ORDER — MELOXICAM 7.5 MG PO TABS: 7.5000 mg | ORAL_TABLET | Freq: Every day | ORAL | 1 refills | Status: DC

## 2021-11-03 MED ORDER — AMLODIPINE BESYLATE 10 MG PO TABS
10.0000 mg | ORAL_TABLET | Freq: Every day | ORAL | 0 refills | Status: DC
Start: 2021-11-03 — End: 2021-12-04

## 2021-11-03 MED ORDER — SERTRALINE HCL 25 MG PO TABS: 25.0000 mg | ORAL_TABLET | Freq: Every day | ORAL | 0 refills | Status: DC
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
## 2021-11-04 ENCOUNTER — Encounter: Payer: Self-pay | Admitting: Family

## 2021-11-06 ENCOUNTER — Inpatient Hospital Stay: Payer: Managed Care, Other (non HMO) | Attending: Hematology and Oncology | Admitting: Hematology and Oncology

## 2021-11-06 ENCOUNTER — Other Ambulatory Visit: Payer: Self-pay

## 2021-11-06 ENCOUNTER — Inpatient Hospital Stay: Payer: Managed Care, Other (non HMO)

## 2021-11-06 VITALS — BP 166/108 | HR 104 | Temp 99.0°F | Resp 20 | Ht 69.65 in | Wt 246.2 lb

## 2021-11-06 DIAGNOSIS — R718 Other abnormality of red blood cells: Secondary | ICD-10-CM | POA: Insufficient documentation

## 2021-11-06 DIAGNOSIS — Z809 Family history of malignant neoplasm, unspecified: Secondary | ICD-10-CM | POA: Diagnosis not present

## 2021-11-06 DIAGNOSIS — I10 Essential (primary) hypertension: Secondary | ICD-10-CM | POA: Diagnosis not present

## 2021-11-06 LAB — CBC WITH DIFFERENTIAL (CANCER CENTER ONLY)
Abs Immature Granulocytes: 0.01 10*3/uL (ref 0.00–0.07)
Basophils Absolute: 0.1 10*3/uL (ref 0.0–0.1)
Basophils Relative: 1 %
Eosinophils Absolute: 0.1 10*3/uL (ref 0.0–0.5)
Eosinophils Relative: 1 %
HCT: 49 % (ref 39.0–52.0)
Hemoglobin: 15.7 g/dL (ref 13.0–17.0)
Immature Granulocytes: 0 %
Lymphocytes Relative: 31 %
Lymphs Abs: 3 10*3/uL (ref 0.7–4.0)
MCH: 25.2 pg — ABNORMAL LOW (ref 26.0–34.0)
MCHC: 32 g/dL (ref 30.0–36.0)
MCV: 78.8 fL — ABNORMAL LOW (ref 80.0–100.0)
Monocytes Absolute: 0.6 10*3/uL (ref 0.1–1.0)
Monocytes Relative: 7 %
Neutro Abs: 5.8 10*3/uL (ref 1.7–7.7)
Neutrophils Relative %: 60 %
Platelet Count: 253 10*3/uL (ref 150–400)
RBC: 6.22 MIL/uL — ABNORMAL HIGH (ref 4.22–5.81)
RDW: 14.3 % (ref 11.5–15.5)
WBC Count: 9.6 10*3/uL (ref 4.0–10.5)
nRBC: 0 % (ref 0.0–0.2)

## 2021-11-06 LAB — CMP (CANCER CENTER ONLY)
ALT: 21 U/L (ref 0–44)
AST: 13 U/L — ABNORMAL LOW (ref 15–41)
Albumin: 4.4 g/dL (ref 3.5–5.0)
Alkaline Phosphatase: 58 U/L (ref 38–126)
Anion gap: 6 (ref 5–15)
BUN: 13 mg/dL (ref 6–20)
CO2: 28 mmol/L (ref 22–32)
Calcium: 9.8 mg/dL (ref 8.9–10.3)
Chloride: 104 mmol/L (ref 98–111)
Creatinine: 0.96 mg/dL (ref 0.61–1.24)
GFR, Estimated: >60 mL/min (ref >60)
Glucose, Bld: 126 mg/dL — ABNORMAL HIGH (ref 70–99)
Potassium: 3.8 mmol/L (ref 3.5–5.1)
Sodium: 138 mmol/L (ref 135–145)
Total Bilirubin: 0.7 mg/dL (ref 0.3–1.2)
Total Protein: 8.4 g/dL — ABNORMAL HIGH (ref 6.5–8.1)

## 2021-11-06 LAB — RETIC PANEL
Immature Retic Fract: 10.1 % (ref 2.3–15.9)
RBC.: 6.16 MIL/uL — ABNORMAL HIGH (ref 4.22–5.81)
Retic Count, Absolute: 54.8 10*3/uL (ref 19.0–186.0)
Retic Ct Pct: 0.9 % (ref 0.4–3.1)
Reticulocyte Hemoglobin: 29.5 pg (ref >27.9)

## 2021-11-06 LAB — IRON AND IRON BINDING CAPACITY (CC-WL,HP ONLY)
Iron: 120 ug/dL (ref 45–182)
Saturation Ratios: 35 % (ref 17.9–39.5)
TIBC: 340 ug/dL (ref 250–450)
UIBC: 220 ug/dL (ref 117–376)

## 2021-11-06 LAB — FERRITIN: Ferritin: 286 ng/mL (ref 24–336)
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
## 2021-11-09 LAB — HGB FRACTIONATION CASCADE
Hgb A2: 2.3 % (ref 1.8–3.2)
Hgb A: 97.7 % (ref 96.4–98.8)
Hgb F: 0 % (ref 0.0–2.0)
Hgb S: 0 %

## 2021-11-10 ENCOUNTER — Encounter: Payer: Self-pay | Admitting: Family

## 2021-11-11 ENCOUNTER — Ambulatory Visit: Payer: Managed Care, Other (non HMO) | Admitting: Family
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
## 2021-11-12 ENCOUNTER — Telehealth: Payer: Self-pay | Admitting: Family

## 2021-11-12 NOTE — Telephone Encounter (Signed)
Patient dropped off disability paperwork for telephone appointment that is schedule for Monday, November 16, 2021 at 8:20 am - pt phone# [phone]. Paperwork placed in provider bin. ?

## 2021-11-13 ENCOUNTER — Ambulatory Visit: Payer: Managed Care, Other (non HMO) | Admitting: Family

## 2021-11-16 ENCOUNTER — Ambulatory Visit: Payer: Managed Care, Other (non HMO) | Admitting: Family

## 2021-11-16 DIAGNOSIS — F411 Generalized anxiety disorder: Secondary | ICD-10-CM

## 2021-11-16 DIAGNOSIS — Z0289 Encounter for other administrative examinations: Secondary | ICD-10-CM | POA: Diagnosis not present

## 2021-11-17 ENCOUNTER — Encounter (HOSPITAL_BASED_OUTPATIENT_CLINIC_OR_DEPARTMENT_OTHER): Payer: Self-pay | Admitting: Cardiovascular Disease

## 2021-11-17 ENCOUNTER — Ambulatory Visit (HOSPITAL_BASED_OUTPATIENT_CLINIC_OR_DEPARTMENT_OTHER): Payer: Managed Care, Other (non HMO) | Admitting: Cardiovascular Disease

## 2021-11-17 DIAGNOSIS — I10 Essential (primary) hypertension: Secondary | ICD-10-CM

## 2021-11-17 DIAGNOSIS — Z006 Encounter for examination for normal comparison and control in clinical research program: Secondary | ICD-10-CM

## 2021-11-17 DIAGNOSIS — E7800 Pure hypercholesterolemia, unspecified: Secondary | ICD-10-CM | POA: Diagnosis not present

## 2021-11-17 HISTORY — DX: Pure hypercholesterolemia, unspecified: E78.00

## 2021-11-17 MED ORDER — CHLORTHALIDONE 25 MG PO TABS: 25.0000 mg | ORAL_TABLET | Freq: Every day | ORAL | 3 refills | Status: DC

## 2021-11-17 NOTE — Patient Instructions (Addendum)
  Subject Name: Curtis Washington met inclusion and exclusion criteria for the Virtual Care and Social Determinant Interventions for the management of hypertension trial.  The informed consent form, study requirements and expectations were reviewed with the subject by Dr. Duke Salvia and myself. The subject was given the opportunity to read the consent and ask questions. The subject verbalized understanding of the trial requirements.  All questions were addressed prior to the signing of the consent form. The subject agreed to participate in the trial and signed the informed consent. The informed consent was obtained prior to performance of any protocol-specific procedures for the subject.  A copy of the signed informed consent was given to the subject and a copy was placed in the subject's medical record.  Curtis Washington was randomized to Group 2.

## 2021-11-17 NOTE — Patient Instructions (Signed)
Medication Instructions:  ?RESTART CHLORTHALIDONE 25 MG DAILY  ? ?Labwork: ?BMET IN 1 WEEK  ? ?Testing/Procedures: ?NONE ? ?Follow-Up: ?12/29/2021 1:00 PM WITH PHARM D ? ?Any Other Special Instructions Will Be Listed Below (If Applicable). ? ?WHEN YOU RETURN YOUR OLD MONITOR WILL HAVE NEW MONITOR TO PICK UP  ?MONITOR YOUR BLOOD PRESSURE WITH NEW MACHINE TWICE A DAY BRING READINGS TO FOLLOW UP  ? ?If you need a refill on your cardiac medications before your next appointment, please call your pharmacy.12/30/2050 ?

## 2021-11-17 NOTE — Assessment & Plan Note (Signed)
Continue atorvastatin
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
## 2021-11-19 LAB — ALPHA-THALASSEMIA GENOTYPR

## 2021-11-25 ENCOUNTER — Other Ambulatory Visit: Payer: Self-pay | Admitting: Family

## 2021-11-25 DIAGNOSIS — I10 Essential (primary) hypertension: Secondary | ICD-10-CM

## 2021-11-25 DIAGNOSIS — F411 Generalized anxiety disorder: Secondary | ICD-10-CM

## 2021-11-25 NOTE — Telephone Encounter (Signed)
Recent appointment 11/17/2021 with Chilton Si, MD. Request refills from the same.

## 2021-11-25 NOTE — Telephone Encounter (Signed)
Sertraline refilled per patient request.  ?

## 2021-11-26 ENCOUNTER — Telehealth: Payer: Self-pay | Admitting: Family

## 2021-11-26 NOTE — Telephone Encounter (Signed)
Fax was received for  a new request for Short Term Disability and placed in Provider bin. Pt already has an upcoming appt scheduled on 12/01/21 and 10:20am. Please advise and thank you.  ?

## 2021-11-27 NOTE — Progress Notes (Signed)
? ? ?Patient ID: Curtis Washington, male    DOB: 1976-03-09  MRN: [id number] ? ?CC: No chief complaint on file. ? ? ?Subjective: ?Curtis Washington is a 46 y.o. male who presents for ?His concerns today include:  ? ?Paperwork update ? ?Last anxiety appt 11/16/2021 continued on Sertraline and Hydroxyzine, referred to Psych  ?Gave other therapy resources due to no providers accepting new therapy patients at this time. ? ? ? ? ?Patient Active Problem List  ? Diagnosis Date Noted  ? Pure hypercholesterolemia 11/17/2021  ? Generalized anxiety disorder 11/03/2021  ? Type 2 diabetes mellitus ([place]) 09/16/2020  ? Essential hypertension 09/08/2020  ?  ? ?Current Outpatient Medications on File Prior to Visit  ?Medication Sig Dispense Refill  ? amLODipine (NORVASC) 10 MG tablet Take 1 tablet (10 mg total) by mouth daily. 30 tablet 0  ? atorvastatin (LIPITOR) 20 MG tablet Take 1 tablet (20 mg total) by mouth daily. 90 tablet 0  ? Blood Glucose Monitoring Suppl (ONE TOUCH ULTRA 2) w/Device KIT 1 each by Does not apply route 2 (two) times daily with a meal. 1 kit 0  ? carvedilol (COREG) 12.5 MG tablet TAKE 1 TABLET BY MOUTH 2 TIMES DAILY. 60 tablet 8  ? chlorthalidone (HYGROTON) 25 MG tablet Take 1 tablet (25 mg total) by mouth daily. 90 tablet 3  ? glucose blood test strip Check blood sugars daily w/meals 100 each 12  ? hydrOXYzine (VISTARIL) 25 MG capsule Take 1 capsule (25 mg total) by mouth every 8 (eight) hours as needed. 30 capsule 0  ? meloxicam (MOBIC) 7.5 MG tablet Take 1 tablet (7.5 mg total) by mouth daily. 30 tablet 1  ? metFORMIN (GLUCOPHAGE) 500 MG tablet Take 1 tablet (500 mg total) by mouth daily with breakfast. 90 tablet 0  ? OneTouch Delica Lancets 19F MISC 1 each by Does not apply route 2 (two) times daily with a meal. 100 each 1  ? sertraline (ZOLOFT) 25 MG tablet Take 1 tablet (25 mg total) by mouth daily. 30 tablet 2  ? ?No current facility-administered medications on file prior to visit.  ? ? ?No Known Allergies ? ?Social  History  ? ?Socioeconomic History  ? Marital status: Divorced  ?  Spouse name: Not on file  ? Number of children: Not on file  ? Years of education: Not on file  ? Highest education level: Not on file  ?Occupational History  ? Not on file  ?Tobacco Use  ? Smoking status: Passive Smoke Exposure - Never Smoker  ? Smokeless tobacco: Never  ?Vaping Use  ? Vaping Use: Never used  ?Substance and Sexual Activity  ? Alcohol use: Yes  ?  Alcohol/week: 2.0 standard drinks  ?  Types: 2 Standard drinks or equivalent per week  ?  Comment: socially  ? Drug use: Not Currently  ? Sexual activity: Not Currently  ?Other Topics Concern  ? Not on file  ?Social History Narrative  ? ** Merged History Encounter **  ?    ? ?Social Determinants of Health  ? ?Financial Resource Strain: Not on file  ?Food Insecurity: Not on file  ?Transportation Needs: Not on file  ?Physical Activity: Not on file  ?Stress: Not on file  ?Social Connections: Not on file  ?Intimate Partner Violence: Not on file  ? ? ?Family History  ?Problem Relation Age of Onset  ? Diabetes Mother   ? Hyperlipidemia Mother   ? Hypertension Mother   ? Cancer Mother   ?  Diabetes Sister   ? Heart disease Sister   ? Hyperlipidemia Sister   ? Alcohol abuse Father   ? Cancer Maternal Grandmother   ? Dementia Maternal Grandfather   ? ? ?No past surgical history on file. ? ?ROS: ?Review of Systems ?Negative except as stated above ? ?PHYSICAL EXAM: ?There were no vitals taken for this visit. ? ?Physical Exam ? ?{male adult master:310786} ?{male adult master:310785} ? ? ?  Latest Ref Rng & Units 11/06/2021  ?  9:43 AM 10/09/2021  ?  2:29 PM 09/08/2020  ?  4:27 PM  ?CMP  ?Glucose 70 - 99 mg/dL 126   96   93    ?BUN 6 - 20 mg/dL $Remove'13   12   17    'UbWKtJq$ ?Creatinine 0.61 - 1.24 mg/dL 0.96   0.97   0.99    ?Sodium 135 - 145 mmol/L 138   145   141    ?Potassium 3.5 - 5.1 mmol/L 3.8   4.7   4.0    ?Chloride 98 - 111 mmol/L 104   104   100    ?CO2 22 - 32 mmol/L $RemoveB'28   20   24    'zsanDupv$ ?Calcium 8.9 - 10.3 mg/dL  9.8   10.2   9.9    ?Total Protein 6.5 - 8.1 g/dL 8.4   8.2     ?Total Bilirubin 0.3 - 1.2 mg/dL 0.7   0.6     ?Alkaline Phos 38 - 126 U/L 58   72     ?AST 15 - 41 U/L 13   20     ?ALT 0 - 44 U/L 21   29     ? ?Lipid Panel  ?   ?Component Value Date/Time  ? CHOL 135 10/09/2021 1429  ? TRIG 75 10/09/2021 1429  ? HDL 38 (L) 10/09/2021 1429  ? CHOLHDL 3.6 10/09/2021 1429  ? [place] 82 10/09/2021 1429  ? ? ?CBC ?   ?Component Value Date/Time  ? WBC 9.6 11/06/2021 0943  ? WBC 7.8 08/19/2018 1331  ? RBC 6.16 (H) 11/06/2021 0943  ? RBC 6.22 (H) 11/06/2021 0943  ? HGB 15.7 11/06/2021 0943  ? HGB 16.5 10/09/2021 1429  ? HCT 49.0 11/06/2021 0943  ? HCT 49.9 10/09/2021 1429  ? PLT 253 11/06/2021 0943  ? PLT 270 10/09/2021 1429  ? MCV 78.8 (L) 11/06/2021 0943  ? MCV 79 10/09/2021 1429  ? MCH 25.2 (L) 11/06/2021 0943  ? MCHC 32.0 11/06/2021 0943  ? RDW 14.3 11/06/2021 0943  ? RDW 14.5 10/09/2021 1429  ? LYMPHSABS 3.0 11/06/2021 0943  ? LYMPHSABS 4.1 (H) 06/30/2020 1842  ? MONOABS 0.6 11/06/2021 0943  ? EOSABS 0.1 11/06/2021 0943  ? EOSABS 0.1 06/30/2020 1842  ? BASOSABS 0.1 11/06/2021 0943  ? BASOSABS 0.1 06/30/2020 1842  ? ? ?ASSESSMENT AND PLAN: ? ?There are no diagnoses linked to this encounter. ? ? ?Patient was given the opportunity to ask questions.  Patient verbalized understanding of the plan and was able to repeat key elements of the plan. Patient was given clear instructions to go to Emergency Department or return to medical center if symptoms don't improve, worsen, or new problems develop.The patient verbalized understanding. ? ? ?No orders of the defined types were placed in this encounter. ? ? ? ?Requested Prescriptions  ? ? No prescriptions requested or ordered in this encounter  ? ? ?No follow-ups on file. ? ?Camillia Herter, NP  ?

## 2021-12-01 ENCOUNTER — Encounter: Payer: Self-pay | Admitting: Family

## 2021-12-01 ENCOUNTER — Other Ambulatory Visit: Payer: Self-pay | Admitting: Family

## 2021-12-01 ENCOUNTER — Encounter: Payer: Managed Care, Other (non HMO) | Admitting: Family

## 2021-12-01 DIAGNOSIS — F411 Generalized anxiety disorder: Secondary | ICD-10-CM

## 2021-12-02 NOTE — Telephone Encounter (Signed)
Make patient aware that during appointment on 11/16/2021 he explained in detail that he would return to work on 12/01/2021. I will need more details about events which occurred since then that's preventing his return to work.  ? ?A referral placed to Psychiatry on 12/01/2021. Their office should call within 2 weeks with appointment details.  ? ?Let's schedule patient an appointment to see if we can extend disability paperwork.

## 2021-12-04 ENCOUNTER — Other Ambulatory Visit: Payer: Self-pay | Admitting: Family

## 2021-12-04 ENCOUNTER — Other Ambulatory Visit: Payer: Self-pay

## 2021-12-04 DIAGNOSIS — I10 Essential (primary) hypertension: Secondary | ICD-10-CM

## 2021-12-04 MED ORDER — AMLODIPINE BESYLATE 10 MG PO TABS: 10.0000 mg | ORAL_TABLET | Freq: Every day | ORAL | 0 refills | Status: DC

## 2021-12-07 ENCOUNTER — Encounter: Payer: Self-pay | Admitting: Family

## 2021-12-08 ENCOUNTER — Encounter: Payer: Self-pay | Admitting: Family

## 2021-12-09 ENCOUNTER — Encounter: Payer: Self-pay | Admitting: Family

## 2021-12-11 ENCOUNTER — Ambulatory Visit (HOSPITAL_BASED_OUTPATIENT_CLINIC_OR_DEPARTMENT_OTHER): Payer: Managed Care, Other (non HMO) | Admitting: Cardiovascular Disease
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
## 2021-12-12 ENCOUNTER — Other Ambulatory Visit: Payer: Self-pay | Admitting: Family

## 2021-12-12 DIAGNOSIS — M25511 Pain in right shoulder: Secondary | ICD-10-CM

## 2021-12-12 DIAGNOSIS — M25512 Pain in left shoulder: Secondary | ICD-10-CM

## 2021-12-12 DIAGNOSIS — E119 Type 2 diabetes mellitus without complications: Secondary | ICD-10-CM

## 2021-12-12 DIAGNOSIS — E785 Hyperlipidemia, unspecified: Secondary | ICD-10-CM

## 2021-12-14 ENCOUNTER — Ambulatory Visit: Payer: Managed Care, Other (non HMO) | Admitting: Family

## 2021-12-14 ENCOUNTER — Telehealth: Payer: Self-pay | Admitting: Family

## 2021-12-14 VITALS — BP 152/100 | HR 123 | Temp 98.3°F | Resp 18 | Ht 69.02 in | Wt 240.0 lb

## 2021-12-14 DIAGNOSIS — F411 Generalized anxiety disorder: Secondary | ICD-10-CM

## 2021-12-14 DIAGNOSIS — Z0289 Encounter for other administrative examinations: Secondary | ICD-10-CM | POA: Diagnosis not present

## 2021-12-14 NOTE — Telephone Encounter (Signed)
If we can get patient contact information to [facility] office please as this is important for paperwork completion he is requesting.

## 2021-12-14 NOTE — Telephone Encounter (Signed)
Requested Prescriptions  Pending Prescriptions Disp Refills  . metFORMIN (GLUCOPHAGE) 500 MG tablet [Pharmacy Med Name: METFORMIN HCL 500 MG TABLET] 30 tablet 2    Sig: TAKE 1 TABLET BY MOUTH EVERY DAY WITH BREAKFAST     Endocrinology:  Diabetes - Biguanides Failed - 12/12/2021 11:31 AM      Failed - B12 Level in normal range and within 720 days    No results found for: VITAMINB12       Failed - CBC within normal limits and completed in the last 12 months    WBC  Date Value Ref Range Status  08/19/2018 7.8 4.0 - 10.5 K/uL Final   WBC Count  Date Value Ref Range Status  11/06/2021 9.6 4.0 - 10.5 K/uL Final   RBC  Date Value Ref Range Status  11/06/2021 6.22 (H) 4.22 - 5.81 MIL/uL Final   RBC.  Date Value Ref Range Status  11/06/2021 6.16 (H) 4.22 - 5.81 MIL/uL Final   Hemoglobin  Date Value Ref Range Status  11/06/2021 15.7 13.0 - 17.0 g/dL Final  10/09/2021 16.5 13.0 - 17.7 g/dL Final   HCT  Date Value Ref Range Status  11/06/2021 49.0 39.0 - 52.0 % Final   Hematocrit  Date Value Ref Range Status  10/09/2021 49.9 37.5 - 51.0 % Final   MCHC  Date Value Ref Range Status  11/06/2021 32.0 30.0 - 36.0 g/dL Final   [facility]  Date Value Ref Range Status  11/06/2021 25.2 (L) 26.0 - 34.0 pg Final   MCV  Date Value Ref Range Status  11/06/2021 78.8 (L) 80.0 - 100.0 fL Final  10/09/2021 79 79 - 97 fL Final   Platelet Count, POC  Date Value Ref Range Status  02/19/2016 199 142 - 424 K/uL Final   RDW  Date Value Ref Range Status  11/06/2021 14.3 11.5 - 15.5 % Final  10/09/2021 14.5 11.6 - 15.4 % Final   RDW, POC  Date Value Ref Range Status  02/19/2016 14.4 % Final         Passed - Cr in normal range and within 360 days    Creatinine  Date Value Ref Range Status  11/06/2021 0.96 0.61 - 1.24 mg/dL Final   Creat  Date Value Ref Range Status  02/19/2016 1.00 0.60 - 1.35 mg/dL Final         Passed - HBA1C is between 0 and 7.9 and within 180 days    Hemoglobin  A1C  Date Value Ref Range Status  10/09/2021 6.6 (A) 4.0 - 5.6 % Final   Hgb A1c MFr Bld  Date Value Ref Range Status  09/12/2020 7.0 (H) 4.8 - 5.6 % Final    Comment:             Prediabetes: 5.7 - 6.4          Diabetes: >6.4          Glycemic control for adults with diabetes: <7.0          Passed - eGFR in normal range and within 360 days    GFR calc Af Amer  Date Value Ref Range Status  09/08/2020 106 >59 mL/min/1.73 Final    Comment:    **In accordance with recommendations from the NKF-ASN Task force,**   [facility] is in the process of updating its eGFR calculation to the   2021 CKD-EPI creatinine equation that estimates kidney function   without a race variable.    GFR, Estimated  Date Value  Ref Range Status  11/06/2021 >60 >60 mL/min Final    Comment:    (NOTE) Calculated using the CKD-EPI Creatinine Equation (2021)    eGFR  Date Value Ref Range Status  10/09/2021 98 >59 mL/min/1.73 Final         Passed - Valid encounter within last 6 months    Recent Outpatient Visits          Today Encounter for completion of form with patient   Primary Care at [facility], [place], NP   1 week ago    Primary Care at [facility], Amy J, NP   4 weeks ago Encounter for completion of form with patient   Primary Care at [facility], Amy J, NP   1 month ago Generalized anxiety disorder   Primary Care at [facility], Flonnie Hailstone, NP   1 month ago Hospital discharge follow-up   Primary Care at [facility], Flonnie Hailstone, NP      Future Appointments            In 2 weeks [facility], [facility]            . atorvastatin (LIPITOR) 20 MG tablet [Pharmacy Med Name: ATORVASTATIN 20 MG TABLET] 30 tablet 2    Sig: TAKE 1 TABLET BY MOUTH EVERY DAY     Cardiovascular:  Antilipid - Statins Failed - 12/12/2021 11:31 AM      Failed - Lipid Panel in normal range within the last 12 months    Cholesterol, Total  Date Value  Ref Range Status  10/09/2021 135 100 - 199 mg/dL Final   LDL Chol Calc (NIH)  Date Value Ref Range Status  10/09/2021 82 0 - 99 mg/dL Final   HDL  Date Value Ref Range Status  10/09/2021 38 (L) >39 mg/dL Final   Triglycerides  Date Value Ref Range Status  10/09/2021 75 0 - 149 mg/dL Final         Passed - Patient is not pregnant      Passed - Valid encounter within last 12 months    Recent Outpatient Visits          Today Encounter for completion of form with patient   Primary Care at [facility], Amy J, NP   1 week ago    Primary Care at [facility], Amy J, NP   4 weeks ago Encounter for completion of form with patient   Primary Care at [facility], Amy J, NP   1 month ago Generalized anxiety disorder   Primary Care at [facility], [place], NP   1 month ago Hospital discharge follow-up   Primary Care at [facility], Amy J, NP      Future Appointments            In 2 weeks [facility], [facility]            . meloxicam (MOBIC) 7.5 MG tablet [Pharmacy Med Name: MELOXICAM 7.5 MG TABLET] 30 tablet 1    Sig: TAKE 1 TABLET BY MOUTH EVERY DAY     Analgesics:  COX2 Inhibitors Failed - 12/12/2021 11:31 AM      Failed - Manual Review: Labs are only required if the patient has taken medication for more than 8 weeks.      Failed - AST in normal range and within 360 days    AST  Date Value Ref Range Status  11/06/2021 13 (L) 15 - 41 U/L Final         Passed - HGB in normal range and within 360 days    Hemoglobin  Date Value Ref Range Status  11/06/2021 15.7 13.0 - 17.0 g/dL Final  10/09/2021 16.5 13.0 - 17.7 g/dL Final         Passed - Cr in normal range and within 360 days    Creatinine  Date Value Ref Range Status  11/06/2021 0.96 0.61 - 1.24 mg/dL Final   Creat  Date Value Ref Range Status  02/19/2016 1.00 0.60 - 1.35 mg/dL Final         Passed - HCT in normal range and within 360  days    HCT  Date Value Ref Range Status  11/06/2021 49.0 39.0 - 52.0 % Final   Hematocrit  Date Value Ref Range Status  10/09/2021 49.9 37.5 - 51.0 % Final         Passed - ALT in normal range and within 360 days    ALT  Date Value Ref Range Status  11/06/2021 21 0 - 44 U/L Final         Passed - eGFR is 30 or above and within 360 days    GFR calc Af Amer  Date Value Ref Range Status  09/08/2020 106 >59 mL/min/1.73 Final    Comment:    **In accordance with recommendations from the NKF-ASN Task force,**   [facility] is in the process of updating its eGFR calculation to the   2021 CKD-EPI creatinine equation that estimates kidney function   without a race variable.    GFR, Estimated  Date Value Ref Range Status  11/06/2021 >60 >60 mL/min Final    Comment:    (NOTE) Calculated using the CKD-EPI Creatinine Equation (2021)    eGFR  Date Value Ref Range Status  10/09/2021 98 >59 mL/min/1.73 Final         Passed - Patient is not pregnant      Passed - Valid encounter within last 12 months    Recent Outpatient Visits          Today Encounter for completion of form with patient   Primary Care at [facility], Amy J, NP   1 week ago    Primary Care at [facility], Amy J, NP   4 weeks ago Encounter for completion of form with patient   Primary Care at [facility], Amy J, NP   1 month ago Generalized anxiety disorder   Primary Care at [facility], Flonnie Hailstone, NP   1 month ago Hospital discharge follow-up   Primary Care at [facility], Flonnie Hailstone, NP      Future Appointments            In 2 weeks [facility], [facility]

## 2021-12-14 NOTE — Progress Notes (Signed)
Pt presents for FMLA paperwork completion needs extended till he sees psychiatrist on 06/15 so wants till 6/19, pt states that he will leave it up to psychiatrist to extend further if needed.

## 2021-12-15 NOTE — Telephone Encounter (Signed)
Hi,  Patient is requesting completion of disability paperwork. Therefore, we will need him to see a psychiatrist in addition to the already scheduled counseling sessions with the LCSW.

## 2021-12-16 NOTE — Telephone Encounter (Signed)
Hi.   On 12/01/2021 I referred patient to Psychiatry located at the [place] office. He does not have an appointment as of present. If we can confirm with their office when patient will be able to be scheduled for an appointment. When appointment is confirmed please reply here so that I am aware and can update patient.   While the patient is already scheduled on 01/06/2022 with a social worker/counselor Bynum Bellows this will not suffice in regards to medical evaluation from a psychiatrist MD, DO, NP, or PA.   Thanks

## 2021-12-23 NOTE — Telephone Encounter (Signed)
Thank you :)

## 2021-12-24 ENCOUNTER — Ambulatory Visit: Payer: Managed Care, Other (non HMO) | Admitting: Family

## 2021-12-25 ENCOUNTER — Other Ambulatory Visit: Payer: Self-pay | Admitting: Family

## 2021-12-25 DIAGNOSIS — F411 Generalized anxiety disorder: Secondary | ICD-10-CM

## 2021-12-25 NOTE — Telephone Encounter (Signed)
-   Sertraline ordered 11/25/2021 for 90 day supply.  - Call patient to confirm that he is aware he has refills available at pharmacy.  - Call pharmacy to confirm refills sent 11/25/2021 and diagnosis code was attached to order.

## 2021-12-28 ENCOUNTER — Encounter: Payer: Self-pay | Admitting: Family

## 2021-12-29 ENCOUNTER — Ambulatory Visit (HOSPITAL_BASED_OUTPATIENT_CLINIC_OR_DEPARTMENT_OTHER): Payer: Managed Care, Other (non HMO)

## 2021-12-29 ENCOUNTER — Other Ambulatory Visit: Payer: Self-pay | Admitting: Family

## 2021-12-29 DIAGNOSIS — I10 Essential (primary) hypertension: Secondary | ICD-10-CM

## 2021-12-29 NOTE — Telephone Encounter (Signed)
Refilled per patient request. 

## 2021-12-30 ENCOUNTER — Ambulatory Visit (HOSPITAL_BASED_OUTPATIENT_CLINIC_OR_DEPARTMENT_OTHER): Payer: Managed Care, Other (non HMO)

## 2021-12-30 NOTE — Telephone Encounter (Signed)
Curtis Washington,   On 11/16/2021 patient reported the following: Reports doesn't have any issues or concerns about performing job requirements but will see more of how this goes once he returns to work.   Call patient with update. If he would like to share with you any work-related limitations I will addend the note once reported back to me.   Thank you

## 2022-01-06 ENCOUNTER — Telehealth (HOSPITAL_COMMUNITY): Payer: Self-pay

## 2022-01-06 ENCOUNTER — Encounter (HOSPITAL_COMMUNITY): Payer: Self-pay

## 2022-01-06 ENCOUNTER — Ambulatory Visit (HOSPITAL_COMMUNITY): Payer: 59 | Admitting: Licensed Clinical Social Worker

## 2022-01-06 DIAGNOSIS — F439 Reaction to severe stress, unspecified: Secondary | ICD-10-CM | POA: Diagnosis not present
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
## 2022-01-06 NOTE — Telephone Encounter (Signed)
This is not a Dr. Gilmore Laroche patient. Routing to [employer]

## 2022-01-06 NOTE — Telephone Encounter (Signed)
New message   Patient asking can FMLA be extended until  02/12/22 until he comes in the office.   Asking for a call back

## 2022-01-08 ENCOUNTER — Ambulatory Visit: Payer: Managed Care, Other (non HMO) | Admitting: Family

## 2022-01-25 ENCOUNTER — Other Ambulatory Visit: Payer: Self-pay | Admitting: Family

## 2022-01-25 DIAGNOSIS — I10 Essential (primary) hypertension: Secondary | ICD-10-CM

## 2022-01-25 DIAGNOSIS — F411 Generalized anxiety disorder: Secondary | ICD-10-CM

## 2022-01-25 NOTE — Telephone Encounter (Signed)
Refilled per patient request. 

## 2022-01-25 NOTE — Telephone Encounter (Signed)
Refilled per patient request and diagnosis code attached.

## 2022-02-04 ENCOUNTER — Other Ambulatory Visit: Payer: Self-pay | Admitting: Family

## 2022-02-04 DIAGNOSIS — M25512 Pain in left shoulder: Secondary | ICD-10-CM

## 2022-02-04 DIAGNOSIS — M25511 Pain in right shoulder: Secondary | ICD-10-CM

## 2022-02-04 NOTE — Telephone Encounter (Signed)
Requested Prescriptions  Pending Prescriptions Disp Refills  . meloxicam (MOBIC) 7.5 MG tablet [Pharmacy Med Name: MELOXICAM 7.5 MG TABLET] 30 tablet 1    Sig: TAKE 1 TABLET BY MOUTH EVERY DAY     Analgesics:  COX2 Inhibitors Failed - 02/04/2022  2:10 PM      Failed - Manual Review: Labs are only required if the patient has taken medication for more than 8 weeks.      Failed - AST in normal range and within 360 days    AST  Date Value Ref Range Status  11/06/2021 13 (L) 15 - 41 U/L Final         Passed - HGB in normal range and within 360 days    Hemoglobin  Date Value Ref Range Status  11/06/2021 15.7 13.0 - 17.0 g/dL Final  10/09/2021 16.5 13.0 - 17.7 g/dL Final         Passed - Cr in normal range and within 360 days    Creatinine  Date Value Ref Range Status  11/06/2021 0.96 0.61 - 1.24 mg/dL Final   Creat  Date Value Ref Range Status  02/19/2016 1.00 0.60 - 1.35 mg/dL Final         Passed - HCT in normal range and within 360 days    HCT  Date Value Ref Range Status  11/06/2021 49.0 39.0 - 52.0 % Final   Hematocrit  Date Value Ref Range Status  10/09/2021 49.9 37.5 - 51.0 % Final         Passed - ALT in normal range and within 360 days    ALT  Date Value Ref Range Status  11/06/2021 21 0 - 44 U/L Final         Passed - eGFR is 30 or above and within 360 days    GFR calc Af Amer  Date Value Ref Range Status  09/08/2020 106 >59 mL/min/1.73 Final    Comment:    **In accordance with recommendations from the NKF-ASN Task force,**   [facility] is in the process of updating its eGFR calculation to the   2021 CKD-EPI creatinine equation that estimates kidney function   without a race variable.    GFR, Estimated  Date Value Ref Range Status  11/06/2021 >60 >60 mL/min Final    Comment:    (NOTE) Calculated using the CKD-EPI Creatinine Equation (2021)    eGFR  Date Value Ref Range Status  10/09/2021 98 >59 mL/min/1.73 Final         Passed - Patient is not  pregnant      Passed - Valid encounter within last 12 months    Recent Outpatient Visits          1 month ago Encounter for completion of form with patient   Primary Care at [facility], Amy J, NP   2 months ago    Primary Care at [facility], Amy J, NP   2 months ago Encounter for completion of form with patient   Primary Care at [facility], Amy J, NP   3 months ago Generalized anxiety disorder   Primary Care at [facility], Flonnie Hailstone, NP   3 months ago Hospital discharge follow-up   Primary Care at [facility], Flonnie Hailstone, NP      Future Appointments            In 3 weeks [facility], [facility]

## 2022-02-12 ENCOUNTER — Ambulatory Visit (INDEPENDENT_AMBULATORY_CARE_PROVIDER_SITE_OTHER): Payer: 59 | Admitting: Licensed Clinical Social Worker

## 2022-02-12 DIAGNOSIS — F439 Reaction to severe stress, unspecified: Secondary | ICD-10-CM | POA: Diagnosis not present

## 2022-02-12 NOTE — Progress Notes (Signed)
   THERAPIST PROGRESS NOTE  Session Time: 8:00 am-8:45 am  Type of Therapy: Individual Therapy  Session#1  Purpose of Session: Prithvi will manage anxiety and mood (trauma response) as evidenced by regulating emotions, coping with flashbacks and dreams, improve sleep schedule, and engage in exposure therapy to build a tolerance to stressors for 5 out of 7 days for 60 days.  ProgressTowards Goals: Initial  Interventions: Therapist utilized CBT and Solution Focused Brief therapy to address anxiety and mood. Therapist provided support and empathy to patient during session. Therapist provided psychoeducation on CBT and the anxiety cycle. Therapist worked with patient to identify ways to manage symptoms of moo.   Effectiveness: Patient was oriented x4 (person, place, situation, and time). Patient was alert, engaged, pleasant, and cooperative. Patient was casually dressed, and appropriately groomed. Patient noted that his mood and anxiety had improved slightly then he got bad news about his disability and job. Patient was told that if the psychiatrist writes him out then his job will keep him but if not he will have to come back to work. Patient feels like he is being forced to come back before he is ready. Patient said that the disability company did not pay him for being out from April -June but approved him for June to Aug. He doesn't understand this. It has also caused him to get behind on bills. Patient is overly stressed which leads to a physical reaction which leads to panic. Patient understood CBT. He also understood the anxiety cycle (trigger, avoidance, short term relief leading to long term anxiety). Patient has been able to drive a little. He still worries and "white knuckles" when he drives but he is able to get out. Patient also noted that his sleep has been disrupted. He has been worrying a lot. Patient was given an anxiety journal prompt to complete daily to identify thoughts, challenge anxious  thoughts, and identify positives that occur daily.  Patient engaged in session. He responded well to interventions. Patient continues to meet criteria for Trauma and stressor-related disorder. Patient will continue in outpatient therapy due to being the least restrictive service to meet his needs. Patient made minimal progress on his goals.   Suicidal/Homicidal: Nowithout intent/plan  Plan: Return again in 2-4 weeks.  Diagnosis: Trauma and stressor-related disorder  Collaboration of Care: Other Resources will be identified.   Patient/Guardian was advised Release of Information must be obtained prior to any record release in order to collaborate their care with an outside provider. Patient/Guardian was advised if they have not already done so to contact the registration department to sign all necessary forms in order for [place] to release information regarding their care.   Consent: Patient/Guardian gives verbal consent for treatment and assignment of benefits for services provided during this visit. Patient/Guardian expressed understanding and agreed to proceed.   Bynum Bellows, LCSW 02/12/2022

## 2022-02-15 ENCOUNTER — Telehealth: Payer: Self-pay

## 2022-02-15 DIAGNOSIS — Z0000 Encounter for general adult medical examination without abnormal findings: Secondary | ICD-10-CM

## 2022-02-15 NOTE — Telephone Encounter (Signed)
Called patient to determine if he had returned the Vivify cuff upon completion of program or if he still had access and need to return the device to Dr. Duke Salvia. Patient stated that he will return the cuff at his next appt on 8/4.  Curtis Washington, [facility] Guide, Health Coach [address] [place] [address] Telephone: [phone] Email: [email or web address]

## 2022-02-26 ENCOUNTER — Ambulatory Visit (HOSPITAL_BASED_OUTPATIENT_CLINIC_OR_DEPARTMENT_OTHER): Payer: Managed Care, Other (non HMO)

## 2022-03-02 ENCOUNTER — Ambulatory Visit (INDEPENDENT_AMBULATORY_CARE_PROVIDER_SITE_OTHER): Payer: Self-pay | Admitting: Licensed Clinical Social Worker

## 2022-03-02 DIAGNOSIS — F439 Reaction to severe stress, unspecified: Secondary | ICD-10-CM | POA: Diagnosis not present

## 2022-03-03 NOTE — Progress Notes (Signed)
   THERAPIST PROGRESS NOTE  Session Time: 8:00 am-8:45 am  Type of Therapy: Individual Therapy  Session#2  Purpose of Session: Rudolph will manage anxiety and mood (trauma response) as evidenced by regulating emotions, coping with flashbacks and dreams, improve sleep schedule, and engage in exposure therapy to build a tolerance to stressors for 5 out of 7 days for 60 days.  ProgressTowards Goals: Progressing  Interventions: Therapist utilized CBT and Solution Focused Brief therapy to address anxiety and mood. Therapist provided support and empathy to patient during session. Therapist followed up on patient's homework to expose himself to driving. Therapist explored patient's feelings to identify triggers for anxiety. Therapist worked with patient to challenge negative thoughts.   Effectiveness: Patient was oriented x4 (person, place, situation, and time). Patient was casually dressed, and appropriately groomed. Patient was alert, engaged, pleasant, and cooperative. Patient noted that he is feeling better driving. He still gets nervous at times with other drivers being too close but he is able to get drive. Patient heard from his job and they have closed down. This is actually a relief for him. 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 Patient also feels like this will force him to find insurance through the market place and try to get his daughter on [place]. Patient already has another job lined up he just needs to be approved to go back to work. Patient noted that when he has time alone he starts to spiral and think about all that has gone wrong. Patient is going to focus on solutions rather than problems.   Patient engaged in session. He responded well to interventions. Patient continues to meet criteria for Trauma and stressor-related disorder. Patient will continue in outpatient therapy due to being the least restrictive service to meet his needs. Patient made minimal  progress on his goals.   Suicidal/Homicidal: Nowithout intent/plan  Plan: Return again in 2-4 weeks. Patient will focus on focusing on solutions/positives rather than focus on what has gone wrong.   Diagnosis: Trauma and stressor-related disorder  Collaboration of Care: Other Resources will be identified.   Patient/Guardian was advised Release of Information must be obtained prior to any record release in order to collaborate their care with an outside provider. Patient/Guardian was advised if they have not already done so to contact the registration department to sign all necessary forms in order for [place] to release information regarding their care.   Consent: Patient/Guardian gives verbal consent for treatment and assignment of benefits for services provided during this visit. Patient/Guardian expressed understanding and agreed to proceed.   Bynum Bellows, LCSW 03/03/2022

## 2022-03-08 ENCOUNTER — Other Ambulatory Visit: Payer: Self-pay | Admitting: Family

## 2022-03-08 DIAGNOSIS — I10 Essential (primary) hypertension: Secondary | ICD-10-CM

## 2022-03-09 NOTE — Telephone Encounter (Signed)
Requested Prescriptions  Pending Prescriptions Disp Refills  . amLODipine (NORVASC) 10 MG tablet [Pharmacy Med Name: AMLODIPINE BESYLATE 10 MG TAB] 30 tablet 1    Sig: TAKE 1 TABLET BY MOUTH EVERY DAY     Cardiovascular: Calcium Channel Blockers 2 Failed - 03/08/2022  2:33 PM      Failed - Last BP in normal range    BP Readings from Last 1 Encounters:  12/14/21 (!) 152/100         Failed - Last Heart Rate in normal range    Pulse Readings from Last 1 Encounters:  12/14/21 (!) 123         Passed - Valid encounter within last 6 months    Recent Outpatient Visits          2 months ago Encounter for completion of form with patient   Primary Care at [facility], Washington, NP   3 months ago    Primary Care at [facility], Amy J, NP   3 months ago Encounter for completion of form with patient   Primary Care at [facility], Amy J, NP   4 months ago Generalized anxiety disorder   Primary Care at [facility], Salomon Fick, NP   4 months ago Hospital discharge follow-up   Primary Care at [facility], Salomon Fick, NP      Future Appointments            In 3 weeks [facility], [facility]

## 2022-03-17 ENCOUNTER — Ambulatory Visit (INDEPENDENT_AMBULATORY_CARE_PROVIDER_SITE_OTHER): Payer: 59 | Admitting: Licensed Clinical Social Worker

## 2022-03-17 DIAGNOSIS — F439 Reaction to severe stress, unspecified: Secondary | ICD-10-CM
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
## 2022-03-31 ENCOUNTER — Ambulatory Visit (HOSPITAL_BASED_OUTPATIENT_CLINIC_OR_DEPARTMENT_OTHER): Payer: Managed Care, Other (non HMO)

## 2022-03-31 NOTE — Progress Notes (Deleted)
      03/31/2022 Curtis Washington 1976/04/29 [id number]   HPI:  Curtis Washington is a 46 y.o. male patient of Dr Oval Linsey, with a [place] below who presents today for hypertension clinic evaluation.  He was seen by Dr. Oval Linsey in April, at which time his pressure was 146/84.  He consented to join [employer], but later declined, worried about high co-pay costs.    Past Medical History: hyperlipidemia 3/23 LDL 82 on atorvastatin 20 mg qd  DM2 2/22 A1c 7.0 on metformin              Blood Pressure Goal:  130/80  Current Medications:   amlodipine 10 mg qd, carvedilol 12.5 mg bid, chlorthalidone 25 mg qd  Family Hx:     Social Hx:      Diet:      Exercise:   Home BP readings:      Intolerances:    Labs:    Wt Readings from Last 3 Encounters:  12/14/21 240 lb (108.9 kg)  11/17/21 245 lb (111.1 kg)  11/06/21 246 lb 3.2 oz (111.7 kg)   BP Readings from Last 3 Encounters:  12/14/21 (!) 152/100  11/17/21 (!) 156/105  11/06/21 (!) 166/108   Pulse Readings from Last 3 Encounters:  12/14/21 (!) 123  11/17/21 99  11/06/21 (!) 104    Current Outpatient Medications  Medication Sig Dispense Refill   amLODipine (NORVASC) 10 MG tablet TAKE 1 TABLET BY MOUTH EVERY DAY 30 tablet 1   atorvastatin (LIPITOR) 20 MG tablet TAKE 1 TABLET BY MOUTH EVERY DAY 30 tablet 2   Blood Glucose Monitoring Suppl (ONE TOUCH ULTRA 2) w/Device KIT 1 each by Does not apply route 2 (two) times daily with a meal. 1 kit 0   carvedilol (COREG) 12.5 MG tablet TAKE 1 TABLET BY MOUTH 2 TIMES DAILY. 60 tablet 8   chlorthalidone (HYGROTON) 25 MG tablet Take 1 tablet (25 mg total) by mouth daily. 90 tablet 3   glucose blood test strip Check blood sugars daily w/meals 100 each 12   hydrOXYzine (VISTARIL) 25 MG capsule Take 1 capsule (25 mg total) by mouth every 8 (eight) hours as needed. 30 capsule 0   meloxicam (MOBIC) 7.5 MG tablet TAKE 1 TABLET BY MOUTH EVERY DAY 30 tablet 1   metFORMIN (GLUCOPHAGE) 500 MG  tablet TAKE 1 TABLET BY MOUTH EVERY DAY WITH BREAKFAST 30 tablet 2   OneTouch Delica Lancets 59D MISC 1 each by Does not apply route 2 (two) times daily with a meal. 100 each 1   sertraline (ZOLOFT) 25 MG tablet Take 1 tablet (25 mg total) by mouth daily. 90 tablet 0   No current facility-administered medications for this visit.    No Known Allergies  Past Medical History:  Diagnosis Date   Essential hypertension 09/08/2020   Hypertension    Pure hypercholesterolemia 11/17/2021    There were no vitals taken for this visit.  No BP recorded.  {Refresh Note OR Click here to enter BP  :1}***    No problem-specific Assessment & Plan notes found for this encounter.   Tommy Medal PharmD CPP [facility] [address] [place], [place] [address] [phone]

## 2022-04-04 ENCOUNTER — Other Ambulatory Visit: Payer: Self-pay | Admitting: Family

## 2022-04-04 DIAGNOSIS — I10 Essential (primary) hypertension: Secondary | ICD-10-CM

## 2022-04-05 NOTE — Telephone Encounter (Signed)
Managed per Cardiology.

## 2022-04-08 ENCOUNTER — Other Ambulatory Visit: Payer: Self-pay | Admitting: Family

## 2022-04-08 ENCOUNTER — Other Ambulatory Visit: Payer: Self-pay

## 2022-04-08 DIAGNOSIS — E785 Hyperlipidemia, unspecified: Secondary | ICD-10-CM

## 2022-04-08 DIAGNOSIS — E119 Type 2 diabetes mellitus without complications: Secondary | ICD-10-CM

## 2022-04-08 DIAGNOSIS — I10 Essential (primary) hypertension: Secondary | ICD-10-CM

## 2022-04-12 ENCOUNTER — Ambulatory Visit (HOSPITAL_COMMUNITY): Payer: Self-pay | Admitting: Licensed Clinical Social Worker

## 2022-04-23 ENCOUNTER — Encounter: Payer: Self-pay | Admitting: Family

## 2022-04-23 ENCOUNTER — Encounter (HOSPITAL_BASED_OUTPATIENT_CLINIC_OR_DEPARTMENT_OTHER): Payer: Self-pay | Admitting: Cardiovascular Disease

## 2022-04-23 NOTE — Telephone Encounter (Signed)
Make patient aware of [place] programs and offer application to see if he is approved.

## 2022-04-26 ENCOUNTER — Telehealth: Payer: Self-pay | Admitting: Licensed Clinical Social Worker

## 2022-04-26 NOTE — Telephone Encounter (Signed)
Packet has been sent to patient via mail

## 2022-04-26 NOTE — Telephone Encounter (Signed)
H&V Care Navigation CSW Progress Note  Clinical Social Worker contacted patient by phone to f/u on message regarding insurance lapsing/lost employment. Pt may be eligible for [facility] and if he has any upcoming appts [place]. No answer at [phone], left voicemail requesting call back. Will re-attempt as able.   Patient is participating in a Managed Medicaid Plan:  No, had [employer], per pt now self-pay.   SDOH Screenings   Depression (PHQ2-9): Medium Risk (01/06/2022)  Tobacco Use: Medium Risk (11/17/2021)   Westley Hummer, MSW, [place]  [phone]- work cell phone (preferred) [phone]- desk phone

## 2022-04-27 ENCOUNTER — Telehealth: Payer: Self-pay | Admitting: Licensed Clinical Social Worker

## 2022-04-27 ENCOUNTER — Telehealth: Payer: Self-pay

## 2022-04-27 DIAGNOSIS — Z0000 Encounter for general adult medical examination without abnormal findings: Secondary | ICD-10-CM

## 2022-04-27 NOTE — Telephone Encounter (Signed)
Called patient to inform him of rescheduled pharmacy appointment at [facility] on 11/6 at 2:30pm and to remind him to return his Vivify cuff. Patient stated the reason he cancelled previous appointment was due to the cancellation of his insurance from his job. Patient stated that he talked to a social worker yesterday and was informed that he was be sent a form to apply for assistance with his visits. Patient stated that if he can bring the Vivify cuff before then that he will call. Patient verbally expressed understanding of the rescheduled appointment.    Ommie Degeorge [facility] Guide, Health Coach [address], [address] [place] [address] Telephone: [phone] Email: [email or web address]

## 2022-04-27 NOTE — Telephone Encounter (Signed)
H&V Care Navigation CSW Progress Note  Clinical Social Worker contacted patient by phone to f/u again for SDOH screening. Pt did not answer at [phone], left additional voicemail. Appears CMA at PCP office has mailed CAFA and additional applications. Pt cannot submit for CAFA until he has an outstanding bill not able to be billed to his Christella Scheuermann, will provide further guidance if pt returns call- will attempt again as able.   Patient is participating in a Managed Medicaid Plan:  No, had Christella Scheuermann, likely lapsed when he left his job.   SDOH Screenings   Depression (PHQ2-9): Medium Risk (01/06/2022)  Tobacco Use: Medium Risk (11/17/2021)   Westley Hummer, MSW, [place]  [phone]- work cell phone (preferred) [phone]- desk phone

## 2022-04-28 ENCOUNTER — Telehealth: Payer: Self-pay | Admitting: Licensed Clinical Social Worker

## 2022-04-28 NOTE — Telephone Encounter (Signed)
H&V Care Navigation CSW Progress Note  Clinical Social Worker contacted patient by phone to f/u for a third time for SDOH screening. Pt did not answer at [phone], left additional voicemail. Pt cannot submit for CAFA until he has an outstanding bill (appt noted 11/6) not able to be billed to his Christella Scheuermann, will provide further guidance if pt returns call- will attempt one more time and I have made note on appt on 11/6 if he attends.    Patient is participating in a Managed Medicaid Plan:  No, had Christella Scheuermann, likely lapsed when he lost his job  SDOH Screenings   Depression (PHQ2-9): Medium Risk (01/06/2022)  Tobacco Use: Medium Risk (11/17/2021)   Westley Hummer, MSW, [place]  [phone]- work cell phone (preferred) [phone]- desk phone

## 2022-05-12 ENCOUNTER — Telehealth: Payer: Self-pay | Admitting: Licensed Clinical Social Worker

## 2022-05-12 NOTE — Telephone Encounter (Signed)
H&V Care Navigation CSW Progress Note  Clinical Social Worker contacted patient by phone to f/u for a fourth time for SDOH screening as my MyChart message has not been read to pt. Pt did not answer at [phone], went straight to voicemail x2. Pt cannot submit for CAFA until he has an outstanding bill (appt noted 11/6) not able to be billed to his Christella Scheuermann, will provide further guidance if pt returns call. I mailed [employer] , [employer], [employer] applications and my card. I included a note indicating I have not been able to reach pt despite calling and sending MyChart.    Patient is participating in a Managed Medicaid Plan:  No, had Christella Scheuermann, likely lapsed when he lost his job  SDOH Screenings   Depression (PHQ2-9): Medium Risk (01/06/2022)  Tobacco Use: Medium Risk (11/17/2021)    Westley Hummer, MSW, [place]  [phone]- work cell phone (preferred) [phone]- desk phone

## 2022-05-31 ENCOUNTER — Ambulatory Visit: Payer: Self-pay | Attending: Family

## 2022-05-31 NOTE — Progress Notes (Deleted)
     05/31/2022 Curtis Washington October 31, 1975 [id number]   HPI:  Curtis Washington is a 47 y.o. male patient of Dr Oval Linsey, with a PMH below who presents today for advanced hypertension clinic follow up.  Patient was referred to our clinic by Durene Fruits NP.   He has had hypertension for several years, believes he was in his early 65's.  Has not been well controlled since diagnosis, with diastolic readings commonly > 100.  He was agreeable to join our [place] RPM study and was randomized to Group 1.  Unfortunately he has not kept any appointments since his first visit with Dr. Oval Linsey in April .   Past Medical History:                   Blood Pressure Goal:  130/80  Current Medications:     Family Hx:     Social Hx:      Diet:      Exercise:   Home BP readings:      Intolerances:    Labs:    Wt Readings from Last 3 Encounters:  12/14/21 240 lb (108.9 kg)  11/17/21 245 lb (111.1 kg)  11/06/21 246 lb 3.2 oz (111.7 kg)   BP Readings from Last 3 Encounters:  12/14/21 (!) 152/100  11/17/21 (!) 156/105  11/06/21 (!) 166/108   Pulse Readings from Last 3 Encounters:  12/14/21 (!) 123  11/17/21 99  11/06/21 (!) 104    Current Outpatient Medications  Medication Sig Dispense Refill   amLODipine (NORVASC) 10 MG tablet Take 1 tablet (10 mg total) by mouth daily. 90 tablet 0   atorvastatin (LIPITOR) 20 MG tablet TAKE 1 TABLET BY MOUTH EVERY DAY 90 tablet 0   Blood Glucose Monitoring Suppl (ONE TOUCH ULTRA 2) w/Device KIT 1 each by Does not apply route 2 (two) times daily with a meal. 1 kit 0   carvedilol (COREG) 12.5 MG tablet TAKE 1 TABLET BY MOUTH 2 TIMES DAILY. 60 tablet 8   chlorthalidone (HYGROTON) 25 MG tablet Take 1 tablet (25 mg total) by mouth daily. 90 tablet 3   glucose blood test strip Check blood sugars daily w/meals 100 each 12   hydrOXYzine (VISTARIL) 25 MG capsule Take 1 capsule (25 mg total) by mouth every 8 (eight) hours as needed. 30 capsule 0   meloxicam (MOBIC)  7.5 MG tablet TAKE 1 TABLET BY MOUTH EVERY DAY 30 tablet 1   metFORMIN (GLUCOPHAGE) 500 MG tablet TAKE 1 TABLET BY MOUTH EVERY DAY WITH BREAKFAST 90 tablet 0   OneTouch Delica Lancets 47Q MISC 1 each by Does not apply route 2 (two) times daily with a meal. 100 each 1   sertraline (ZOLOFT) 25 MG tablet Take 1 tablet (25 mg total) by mouth daily. 90 tablet 0   No current facility-administered medications for this visit.    No Known Allergies  Past Medical History:  Diagnosis Date   Essential hypertension 09/08/2020   Hypertension    Pure hypercholesterolemia 11/17/2021    There were no vitals taken for this visit.  No BP recorded.  {Refresh Note OR Click here to enter BP  :1}***    No problem-specific Assessment & Plan notes found for this encounter.   Tommy Medal PharmD CPP [place] [facility] [address] [place], [place] [address] [phone]

## 2022-09-21 NOTE — Progress Notes (Signed)
Patient ID: Curtis Washington, male    DOB: 29-Mar-1976  MRN: [id number]  CC: Diabetes Follow-Up  Subjective: Curtis Washington is a 47 y.o. male who presents for diabetes follow-up.   His concerns today include:  States he has not taken Metformin for a while due to having no health insurance. 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 He is trying to monitor what he eats and trying to exercise. He plans to schedule a follow-up appointment with his cardiologist soon. He has no further issues/concerns for discussion today.   Patient Active Problem List   Diagnosis Date Noted   Pure hypercholesterolemia 11/17/2021   Generalized anxiety disorder 11/03/2021   Type 2 diabetes mellitus ([place]) 09/16/2020   Essential hypertension 09/08/2020     Current Outpatient Medications on File Prior to Visit  Medication Sig Dispense Refill   amLODipine (NORVASC) 10 MG tablet Take 1 tablet (10 mg total) by mouth daily. 90 tablet 0   atorvastatin (LIPITOR) 20 MG tablet TAKE 1 TABLET BY MOUTH EVERY DAY 90 tablet 0   Blood Glucose Monitoring Suppl (ONE TOUCH ULTRA 2) w/Device KIT 1 each by Does not apply route 2 (two) times daily with a meal. 1 kit 0   carvedilol (COREG) 12.5 MG tablet TAKE 1 TABLET BY MOUTH 2 TIMES DAILY. 60 tablet 8   chlorthalidone (HYGROTON) 25 MG tablet Take 1 tablet (25 mg total) by mouth daily. 90 tablet 3   glucose blood test strip Check blood sugars daily w/meals 100 each 12   hydrOXYzine (VISTARIL) 25 MG capsule Take 1 capsule (25 mg total) by mouth every 8 (eight) hours as needed. 30 capsule 0   meloxicam (MOBIC) 7.5 MG tablet TAKE 1 TABLET BY MOUTH EVERY DAY 30 tablet 1   OneTouch Delica Lancets 99991111 MISC 1 each by Does not apply route 2 (two) times daily with a meal. 100 each 1   sertraline (ZOLOFT) 25 MG tablet Take 1 tablet (25 mg total) by mouth daily. 90 tablet 0   No current facility-administered medications on file prior to visit.    No Known Allergies  Social  History   Socioeconomic History   Marital status: Divorced    Spouse name: Not on file   Number of children: Not on file   Years of education: Not on file   Highest education level: Not on file  Occupational History   Not on file  Tobacco Use   Smoking status: Never    Passive exposure: Yes   Smokeless tobacco: Never  Vaping Use   Vaping Use: Never used  Substance and Sexual Activity   Alcohol use: Yes    Alcohol/week: 2.0 standard drinks of alcohol    Types: 2 Standard drinks or equivalent per week    Comment: socially   Drug use: Not Currently   Sexual activity: Not Currently  Other Topics Concern   Not on file  Social History Narrative   ** Merged History Encounter **       Social Determinants of Health   Financial Resource Strain: Not on file  Food Insecurity: Not on file  Transportation Needs: Not on file  Physical Activity: Not on file  Stress: Not on file  Social Connections: Not on file  Intimate Partner Violence: Not on file    Family History  Problem Relation Age of Onset   Diabetes Mother    Hyperlipidemia Mother    Hypertension Mother    Cancer Mother  Diabetes Sister    Heart disease Sister    Hyperlipidemia Sister    Alcohol abuse Father    Cancer Maternal Grandmother    Dementia Maternal Grandfather     No past surgical history on file.  ROS: Review of Systems Negative except as stated above  PHYSICAL EXAM: BP (!) 156/97   Pulse 93   Temp 98.3 F (36.8 C)   Ht 5' 9.02" (1.753 m)   Wt 250 lb (113.4 kg)   SpO2 98%   BMI 36.90 kg/m   Physical Exam HENT:     Head: Normocephalic and atraumatic.  Eyes:     Extraocular Movements: Extraocular movements intact.     Conjunctiva/sclera: Conjunctivae normal.     Pupils: Pupils are equal, round, and reactive to light.  Cardiovascular:     Rate and Rhythm: Normal rate and regular rhythm.     Pulses: Normal pulses.     Heart sounds: Normal heart sounds.  Pulmonary:     Effort:  Pulmonary effort is normal.     Breath sounds: Normal breath sounds.  Musculoskeletal:     Cervical back: Normal range of motion and neck supple.  Neurological:     General: No focal deficit present.     Mental Status: He is alert and oriented to person, place, and time.  Psychiatric:        Mood and Affect: Mood normal.        Behavior: Behavior normal.     Results for orders placed or performed in visit on 09/24/22  POCT glycosylated hemoglobin (Hb A1C)  Result Value Ref Range   Hemoglobin A1C     HbA1c POC (<> result, manual entry)     HbA1c, POC (prediabetic range)     HbA1c, POC (controlled diabetic range) 7.0 0.0 - 7.0 %    ASSESSMENT AND PLAN: 1. Type 2 diabetes mellitus without complication, without long-term current use of insulin (HCC) - Hemoglobin A1c at goal at 7.0%, goal 7%. However, this is increased compared to previous 6.6%. - Resume Metformin as prescribed.  - Routine screening.  - Discussed the importance of healthy eating habits, low-carbohydrate diet, low-sugar diet, regular aerobic exercise (at least 150 minutes a week as tolerated) and medication compliance to achieve or maintain control of diabetes. - Follow-up with primary provider in 6 months or sooner if needed.  - POCT glycosylated hemoglobin (Hb 123XX123) - Basic Metabolic Panel - metFORMIN (GLUCOPHAGE) 500 MG tablet; TAKE 1 TABLET BY MOUTH EVERY DAY WITH BREAKFAST  Dispense: 90 tablet; Refill: 0  Patient was given the opportunity to ask questions.  Patient verbalized understanding of the plan and was able to repeat key elements of the plan. Patient was given clear instructions to go to Emergency Department or return to medical center if symptoms don't improve, worsen, or new problems develop.The patient verbalized understanding.   Orders Placed This Encounter  Procedures   Basic Metabolic Panel   POCT glycosylated hemoglobin (Hb A1C)    Requested Prescriptions   Signed Prescriptions Disp Refills    metFORMIN (GLUCOPHAGE) 500 MG tablet 90 tablet 0    Sig: TAKE 1 TABLET BY MOUTH EVERY DAY WITH BREAKFAST    Return in about 6 months (around 03/27/2023) for Follow-Up or next available chronic care mgmt .  Camillia Herter, NP

## 2022-09-24 ENCOUNTER — Ambulatory Visit (INDEPENDENT_AMBULATORY_CARE_PROVIDER_SITE_OTHER): Payer: BC Managed Care – PPO | Admitting: Family

## 2022-09-24 ENCOUNTER — Encounter: Payer: Self-pay | Admitting: Family

## 2022-09-24 VITALS — BP 156/97 | HR 93 | Temp 98.3°F | Ht 69.02 in | Wt 250.0 lb

## 2022-09-24 DIAGNOSIS — E119 Type 2 diabetes mellitus without complications: Secondary | ICD-10-CM | POA: Diagnosis not present

## 2022-09-24 LAB — POCT GLYCOSYLATED HEMOGLOBIN (HGB A1C): HbA1c, POC (controlled diabetic range): 7 % (ref 0.0–7.0)

## 2022-09-24 MED ORDER — METFORMIN HCL 500 MG PO TABS: ORAL_TABLET | ORAL | 0 refills | Status: DC

## 2022-09-24 NOTE — Progress Notes (Signed)
Pt presents for diabetes f/u    

## 2022-09-25 LAB — BASIC METABOLIC PANEL
BUN/Creatinine Ratio: 13 (ref 9–20)
BUN: 12 mg/dL (ref 6–24)
CO2: 21 mmol/L (ref 20–29)
Calcium: 9.5 mg/dL (ref 8.7–10.2)
Chloride: 102 mmol/L (ref 96–106)
Creatinine, Ser: 0.91 mg/dL (ref 0.76–1.27)
Glucose: 85 mg/dL (ref 70–99)
Potassium: 4.2 mmol/L (ref 3.5–5.2)
Sodium: 140 mmol/L (ref 134–144)
eGFR: 105 mL/min/{1.73_m2} (ref >59)

## 2022-09-30 ENCOUNTER — Ambulatory Visit (HOSPITAL_BASED_OUTPATIENT_CLINIC_OR_DEPARTMENT_OTHER): Payer: BC Managed Care – PPO | Admitting: Cardiovascular Disease

## 2022-11-03 ENCOUNTER — Ambulatory Visit (HOSPITAL_BASED_OUTPATIENT_CLINIC_OR_DEPARTMENT_OTHER): Payer: BC Managed Care – PPO | Admitting: Cardiovascular Disease

## 2022-11-18 ENCOUNTER — Ambulatory Visit (HOSPITAL_BASED_OUTPATIENT_CLINIC_OR_DEPARTMENT_OTHER): Payer: BC Managed Care – PPO | Admitting: Cardiovascular Disease

## 2022-12-30 ENCOUNTER — Ambulatory Visit (HOSPITAL_BASED_OUTPATIENT_CLINIC_OR_DEPARTMENT_OTHER): Payer: BC Managed Care – PPO | Admitting: Family

## 2022-12-30 ENCOUNTER — Encounter (HOSPITAL_BASED_OUTPATIENT_CLINIC_OR_DEPARTMENT_OTHER): Payer: Self-pay | Admitting: Family

## 2022-12-30 VITALS — BP 174/104 | HR 101 | Ht 69.0 in | Wt 250.0 lb

## 2022-12-30 DIAGNOSIS — E782 Mixed hyperlipidemia: Secondary | ICD-10-CM | POA: Diagnosis not present

## 2022-12-30 DIAGNOSIS — I10 Essential (primary) hypertension: Secondary | ICD-10-CM

## 2022-12-30 DIAGNOSIS — Z006 Encounter for examination for normal comparison and control in clinical research program: Secondary | ICD-10-CM

## 2022-12-30 DIAGNOSIS — E119 Type 2 diabetes mellitus without complications: Secondary | ICD-10-CM | POA: Diagnosis not present

## 2022-12-30 DIAGNOSIS — Z7984 Long term (current) use of oral hypoglycemic drugs: Secondary | ICD-10-CM

## 2022-12-30 MED ORDER — CARVEDILOL 12.5 MG PO TABS
12.5000 mg | ORAL_TABLET | Freq: Two times a day (BID) | ORAL | 1 refills | Status: DC
Start: 2022-12-30 — End: 2023-01-03

## 2022-12-30 MED ORDER — AMLODIPINE BESYLATE 10 MG PO TABS: 10.0000 mg | ORAL_TABLET | Freq: Every day | ORAL | 1 refills | Status: DC

## 2022-12-30 MED ORDER — CHLORTHALIDONE 25 MG PO TABS
25.0000 mg | ORAL_TABLET | Freq: Every day | ORAL | 3 refills | Status: DC
Start: 2022-12-30 — End: 2023-01-03

## 2022-12-30 MED ORDER — ATORVASTATIN CALCIUM 20 MG PO TABS
20.0000 mg | ORAL_TABLET | Freq: Every day | ORAL | 1 refills | Status: DC
Start: 2022-12-30 — End: 2023-01-03

## 2022-12-30 NOTE — Research (Signed)
I saw pt today after Gillian Shields NP follow up visit. Pt is in Dr. Leonides Sake Virtual Care HTN Study. Pt filled out research survey. Pt was enrolled in Group 2. Pt completed the Virtual Care HTN Study.
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
## 2022-12-30 NOTE — Patient Instructions (Addendum)
Medication Instructions:  Your physician recommends that you continue on your current medications as directed. Please refer to the Current Medication list given to you today.  We have sent in refills for you today!    Labwork: Please return for Lab work in one week for CMP, CBC, TSH, Cortisol & renin- aldosterone . You may come to the...   [facility] (3rd floor) [address], [place], [place] [address]  Open: 8am-Noon and 1pm-4:30pm  Please ring the doorbell on the small table when you exit the elevator and the Lab Tech will come get you  [facility] at [facility] [address], [place], [place] [address] Open: 8am-1pm, then 2pm-4:30pm   [facility]- Please see attached locations sheet stapled to your lab work with address and hours.   Follow-Up: 6-8 weeks in ADV HTN CLINIC

## 2022-12-31 ENCOUNTER — Encounter (HOSPITAL_BASED_OUTPATIENT_CLINIC_OR_DEPARTMENT_OTHER): Payer: Self-pay

## 2022-12-31 DIAGNOSIS — I10 Essential (primary) hypertension: Secondary | ICD-10-CM

## 2023-01-03 MED ORDER — ATORVASTATIN CALCIUM 20 MG PO TABS: 20.0000 mg | ORAL_TABLET | Freq: Every day | ORAL | 11 refills | Status: DC

## 2023-01-03 MED ORDER — AMLODIPINE BESYLATE 10 MG PO TABS: 10.0000 mg | ORAL_TABLET | Freq: Every day | ORAL | 11 refills | Status: DC

## 2023-01-03 MED ORDER — CHLORTHALIDONE 25 MG PO TABS
25.0000 mg | ORAL_TABLET | Freq: Every day | ORAL | 11 refills | Status: DC
Start: 2023-01-03 — End: 2023-05-05

## 2023-01-03 MED ORDER — CARVEDILOL 12.5 MG PO TABS
12.5000 mg | ORAL_TABLET | Freq: Two times a day (BID) | ORAL | 11 refills | Status: DC
Start: 2023-01-03 — End: 2023-05-05

## 2023-01-17 ENCOUNTER — Encounter (HOSPITAL_BASED_OUTPATIENT_CLINIC_OR_DEPARTMENT_OTHER): Payer: Self-pay

## 2023-02-03 ENCOUNTER — Ambulatory Visit (HOSPITAL_BASED_OUTPATIENT_CLINIC_OR_DEPARTMENT_OTHER): Payer: BC Managed Care – PPO | Admitting: Family

## 2023-03-17 ENCOUNTER — Encounter (HOSPITAL_BASED_OUTPATIENT_CLINIC_OR_DEPARTMENT_OTHER): Payer: BC Managed Care – PPO | Admitting: Family

## 2023-03-28 ENCOUNTER — Other Ambulatory Visit: Payer: Self-pay | Admitting: Family

## 2023-03-28 DIAGNOSIS — E119 Type 2 diabetes mellitus without complications: Secondary | ICD-10-CM

## 2023-03-29 ENCOUNTER — Encounter: Payer: Self-pay | Admitting: Pharmacist

## 2023-03-29 ENCOUNTER — Other Ambulatory Visit: Payer: Self-pay

## 2023-03-29 DIAGNOSIS — E119 Type 2 diabetes mellitus without complications: Secondary | ICD-10-CM

## 2023-03-29 MED ORDER — METFORMIN HCL 500 MG PO TABS
ORAL_TABLET | ORAL | 0 refills | Status: DC
Start: 2023-03-29 — End: 2023-07-15

## 2023-03-30 ENCOUNTER — Encounter: Payer: Self-pay | Admitting: Family

## 2023-03-30 ENCOUNTER — Ambulatory Visit: Payer: BC Managed Care – PPO | Admitting: Family

## 2023-03-30 VITALS — BP 164/110 | HR 91 | Temp 98.5°F | Ht 69.0 in | Wt 245.8 lb

## 2023-03-30 DIAGNOSIS — E119 Type 2 diabetes mellitus without complications: Secondary | ICD-10-CM

## 2023-03-30 DIAGNOSIS — Z7984 Long term (current) use of oral hypoglycemic drugs: Secondary | ICD-10-CM

## 2023-03-30 DIAGNOSIS — E1165 Type 2 diabetes mellitus with hyperglycemia: Secondary | ICD-10-CM

## 2023-03-30 NOTE — Progress Notes (Signed)
Patient ID: Curtis Washington, male    DOB: [birth year]  MRN: [id number]  CC: Chronic Conditions Follow-Up  Subjective: Curtis Washington is a 47 y.o. male who presents for chronic conditions follow-up.   His concerns today include:  - Doing well on Metformin, no issues/concerns. He denies red flag symptoms associated with diabetes.  - Established with Cardiology for chronic conditions management. States he has not taken his blood pressure medication for today as of present. He does not complain of red flag symptoms such as but not limited to chest pain, shortness of breath, worst headache of life, nausea/vomiting.  - I discussed with patient in detail knot is rib cage. Patient verbalized understanding. - No further issues/concerns for discussion today.   Patient Active Problem List   Diagnosis Date Noted   Pure hypercholesterolemia 11/17/2021   Generalized anxiety disorder 11/03/2021   Type 2 diabetes mellitus (HCC) 09/16/2020   Essential hypertension 09/08/2020     Current Outpatient Medications on File Prior to Visit  Medication Sig Dispense Refill   amLODipine (NORVASC) 10 MG tablet Take 1 tablet (10 mg total) by mouth daily. 30 tablet 11   atorvastatin (LIPITOR) 20 MG tablet Take 1 tablet (20 mg total) by mouth daily. 30 tablet 11   carvedilol (COREG) 12.5 MG tablet Take 1 tablet (12.5 mg total) by mouth 2 (two) times daily. 60 tablet 11   chlorthalidone (HYGROTON) 25 MG tablet Take 1 tablet (25 mg total) by mouth daily. 30 tablet 11   metFORMIN (GLUCOPHAGE) 500 MG tablet TAKE 1 TABLET BY MOUTH EVERY DAY WITH BREAKFAST 90 tablet 0   metFORMIN (GLUCOPHAGE) 500 MG tablet TAKE 1 TABLET BY MOUTH EVERY DAY WITH BREAKFAST 90 tablet 0   No current facility-administered medications on file prior to visit.    No Known Allergies  Social History   Socioeconomic History   Marital status: Divorced    Spouse name: Not on file   Number of children: Not on file   Years of education: Not on file    Highest education level: Not on file  Occupational History   Not on file  Tobacco Use   Smoking status: Never    Passive exposure: Yes   Smokeless tobacco: Never  Vaping Use   Vaping status: Never Used  Substance and Sexual Activity   Alcohol use: Yes    Alcohol/week: 2.0 standard drinks of alcohol    Types: 2 Standard drinks or equivalent per week    Comment: socially   Drug use: Not Currently   Sexual activity: Not Currently  Other Topics Concern   Not on file  Social History Narrative   ** Merged History Encounter **       Social Determinants of Health   Financial Resource Strain: Not on file  Food Insecurity: Not on file  Transportation Needs: Not on file  Physical Activity: Not on file  Stress: Not on file  Social Connections: Not on file  Intimate Partner Violence: Not on file    Family History  Problem Relation Age of Onset   Diabetes Mother    Hyperlipidemia Mother    Hypertension Mother    Cancer Mother    Diabetes Sister    Heart disease Sister    Hyperlipidemia Sister    Alcohol abuse Father    Cancer Maternal Grandmother    Dementia Maternal Grandfather     No past surgical history on file.  ROS: Review of Systems Negative except as stated above  PHYSICAL EXAM: BP (!) 162/118   Pulse 91   Temp 98.5 F (36.9 C) (Oral)   Ht 5\' 9"  (1.753 m)   Wt 245 lb 12.8 oz (111.5 kg)   SpO2 94%   BMI 36.30 kg/m   Physical Exam HENT:     Head: Normocephalic and atraumatic.     Nose: Nose normal.     Mouth/Throat:     Mouth: Mucous membranes are moist.     Pharynx: Oropharynx is clear.  Eyes:     Extraocular Movements: Extraocular movements intact.     Conjunctiva/sclera: Conjunctivae normal.     Pupils: Pupils are equal, round, and reactive to light.  Cardiovascular:     Rate and Rhythm: Normal rate and regular rhythm.     Pulses: Normal pulses.     Heart sounds: Normal heart sounds.  Pulmonary:     Effort: Pulmonary effort is normal.      Breath sounds: Normal breath sounds.  Abdominal:     General: Bowel sounds are normal.     Palpations: Abdomen is soft.  Musculoskeletal:        General: Normal range of motion.     Cervical back: Normal range of motion and neck supple.  Neurological:     General: No focal deficit present.     Mental Status: He is alert and oriented to person, place, and time.  Psychiatric:        Mood and Affect: Mood normal.        Behavior: Behavior normal.     ASSESSMENT AND PLAN: 1. Type 2 diabetes mellitus with hyperglycemia, without long-term current use of insulin (HCC) - Continue Metformin as prescribed. No refills needed as of present.  - Routine screening. Result pending. - Discussed the importance of healthy eating habits, low-carbohydrate diet, low-sugar diet, regular aerobic exercise (at least 150 minutes a week as tolerated) and medication compliance to achieve or maintain control of diabetes. - Follow-up with primary provider as scheduled.  - Hemoglobin A1c - Basic Metabolic Panel - Microalbumin / creatinine urine ratio  2. Diabetic eye exam [facility]) - Referral to Ophthalmology for evaluation/management.  - Ambulatory referral to Ophthalmology  3. Encounter for diabetic foot exam [facility]) - Referral to Podiatry for evaluation/management.  - Ambulatory referral to Podiatry    Patient was given the opportunity to ask questions.  Patient verbalized understanding of the plan and was able to repeat key elements of the plan. Patient was given clear instructions to go to Emergency Department or return to medical center if symptoms don't improve, worsen, or new problems develop.The patient verbalized understanding.   Orders Placed This Encounter  Procedures   Hemoglobin A1c   Basic Metabolic Panel   Microalbumin / creatinine urine ratio   Ambulatory referral to Podiatry   Ambulatory referral to Ophthalmology    Return in about 3 months (around 06/29/2023) for Follow-Up or next  available chronic conditions .  Rema Fendt, NP

## 2023-03-30 NOTE — Progress Notes (Signed)
Pt states a knot on his chest.

## 2023-03-31 LAB — HEMOGLOBIN A1C
Est. average glucose Bld gHb Est-mCnc: 148 mg/dL
Hgb A1c MFr Bld: 6.8 % — ABNORMAL HIGH (ref 4.8–5.6)

## 2023-03-31 NOTE — Progress Notes (Signed)
This encounter was created in error - please disregard.

## 2023-04-11 ENCOUNTER — Ambulatory Visit (INDEPENDENT_AMBULATORY_CARE_PROVIDER_SITE_OTHER): Payer: BC Managed Care – PPO | Admitting: Podiatry

## 2023-04-11 DIAGNOSIS — Z91199 Patient's noncompliance with other medical treatment and regimen due to unspecified reason: Secondary | ICD-10-CM

## 2023-04-11 NOTE — Progress Notes (Signed)
No show

## 2023-04-27 DIAGNOSIS — E119 Type 2 diabetes mellitus without complications: Secondary | ICD-10-CM | POA: Diagnosis not present

## 2023-04-27 LAB — HM DIABETES EYE EXAM

## 2023-05-05 ENCOUNTER — Other Ambulatory Visit (HOSPITAL_COMMUNITY): Payer: Self-pay

## 2023-05-05 ENCOUNTER — Encounter (HOSPITAL_BASED_OUTPATIENT_CLINIC_OR_DEPARTMENT_OTHER): Payer: Self-pay | Admitting: Family

## 2023-05-05 ENCOUNTER — Ambulatory Visit (HOSPITAL_BASED_OUTPATIENT_CLINIC_OR_DEPARTMENT_OTHER): Payer: BC Managed Care – PPO | Admitting: Family

## 2023-05-05 VITALS — BP 128/90 | HR 85 | Ht 69.0 in | Wt 245.6 lb

## 2023-05-05 DIAGNOSIS — I10 Essential (primary) hypertension: Secondary | ICD-10-CM | POA: Diagnosis not present

## 2023-05-05 DIAGNOSIS — E119 Type 2 diabetes mellitus without complications: Secondary | ICD-10-CM

## 2023-05-05 DIAGNOSIS — E782 Mixed hyperlipidemia: Secondary | ICD-10-CM

## 2023-05-05 MED ORDER — CHLORTHALIDONE 25 MG PO TABS
25.0000 mg | ORAL_TABLET | Freq: Every day | ORAL | 3 refills | Status: DC
  Filled 2023-05-05 – 2023-09-01 (×4): qty 90, 90d supply, fill #0

## 2023-05-05 MED ORDER — ATORVASTATIN CALCIUM 20 MG PO TABS
20.0000 mg | ORAL_TABLET | Freq: Every day | ORAL | 3 refills | Status: DC
  Filled 2023-05-05 – 2023-09-01 (×4): qty 90, 90d supply, fill #0

## 2023-05-05 MED ORDER — AMLODIPINE BESYLATE 10 MG PO TABS
10.0000 mg | ORAL_TABLET | Freq: Every day | ORAL | 3 refills | Status: DC
  Filled 2023-05-05 – 2023-09-01 (×4): qty 90, 90d supply, fill #0

## 2023-05-05 MED ORDER — CARVEDILOL 12.5 MG PO TABS
12.5000 mg | ORAL_TABLET | Freq: Two times a day (BID) | ORAL | 3 refills | Status: DC
  Filled 2023-05-05 – 2023-09-01 (×4): qty 180, 90d supply, fill #0

## 2023-05-05 NOTE — Patient Instructions (Addendum)
Medication Instructions:  Your physician recommends that you continue on your current medications as directed. Please refer to the Current Medication list given to you today.  We have sent your Cardiac Prescriptions to [facility] to be mailed to you! Their number is [phone]!   Labwork: Your physician recommends that you return for lab work today- renin-aldosterone,  CMP, CBC     Testing/Procedures: Your physician has requested that you have a renal artery duplex. During this test, an ultrasound is used to evaluate blood flow to the kidneys. Allow one hour for this exam. Do not eat after midnight the day before and avoid carbonated beverages. Take your medications as you usually do.    Follow-Up: 6 months in ADV HTN CLINIC with Dr. Duke Salvia or Gillian Shields, NP
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
## 2023-05-09 LAB — COMPREHENSIVE METABOLIC PANEL
ALT: 22 [IU]/L (ref 0–44)
AST: 20 [IU]/L (ref 0–40)
Albumin: 4.2 g/dL (ref 4.1–5.1)
Alkaline Phosphatase: 67 [IU]/L (ref 44–121)
BUN/Creatinine Ratio: 15 (ref 9–20)
BUN: 15 mg/dL (ref 6–24)
Bilirubin Total: 0.4 mg/dL (ref 0.0–1.2)
CO2: 23 mmol/L (ref 20–29)
Calcium: 10 mg/dL (ref 8.7–10.2)
Chloride: 104 mmol/L (ref 96–106)
Creatinine, Ser: 0.97 mg/dL (ref 0.76–1.27)
Globulin, Total: 3.1 g/dL (ref 1.5–4.5)
Glucose: 102 mg/dL — ABNORMAL HIGH (ref 70–99)
Potassium: 3.8 mmol/L (ref 3.5–5.2)
Sodium: 143 mmol/L (ref 134–144)
Total Protein: 7.3 g/dL (ref 6.0–8.5)
eGFR: 97 mL/min/{1.73_m2} (ref >59)

## 2023-05-09 LAB — ALDOSTERONE + RENIN ACTIVITY W/ RATIO
Aldos/Renin Ratio: 13.1 (ref 0.0–30.0)
Aldosterone: 5.4 ng/dL (ref 0.0–30.0)
Renin Activity, Plasma: 0.412 ng/mL/h (ref 0.167–5.380)

## 2023-05-09 LAB — CBC
Hematocrit: 45 % (ref 37.5–51.0)
Hemoglobin: 14.3 g/dL (ref 13.0–17.7)
MCH: 25.6 pg — ABNORMAL LOW (ref 26.6–33.0)
MCHC: 31.8 g/dL (ref 31.5–35.7)
MCV: 81 fL (ref 79–97)
Platelets: 263 10*3/uL (ref 150–450)
RBC: 5.58 x10E6/uL (ref 4.14–5.80)
RDW: 14 % (ref 11.6–15.4)
WBC: 9.5 10*3/uL (ref 3.4–10.8)

## 2023-05-31 ENCOUNTER — Encounter (HOSPITAL_BASED_OUTPATIENT_CLINIC_OR_DEPARTMENT_OTHER): Payer: BC Managed Care – PPO

## 2023-06-15 ENCOUNTER — Encounter (HOSPITAL_BASED_OUTPATIENT_CLINIC_OR_DEPARTMENT_OTHER): Payer: BC Managed Care – PPO

## 2023-06-29 ENCOUNTER — Ambulatory Visit: Payer: BC Managed Care – PPO | Admitting: Family

## 2023-07-08 ENCOUNTER — Encounter (INDEPENDENT_AMBULATORY_CARE_PROVIDER_SITE_OTHER): Payer: BC Managed Care – PPO | Admitting: Family

## 2023-07-08 NOTE — Progress Notes (Signed)
Erroneous encounter-disregard

## 2023-07-12 ENCOUNTER — Encounter (HOSPITAL_BASED_OUTPATIENT_CLINIC_OR_DEPARTMENT_OTHER): Payer: BC Managed Care – PPO

## 2023-07-15 ENCOUNTER — Ambulatory Visit: Payer: BC Managed Care – PPO | Admitting: Family

## 2023-07-15 ENCOUNTER — Encounter: Payer: Self-pay | Admitting: Family

## 2023-07-15 VITALS — BP 143/94 | HR 81 | Temp 97.9°F | Ht 69.0 in | Wt 245.8 lb

## 2023-07-15 DIAGNOSIS — Z7984 Long term (current) use of oral hypoglycemic drugs: Secondary | ICD-10-CM

## 2023-07-15 DIAGNOSIS — L720 Epidermal cyst: Secondary | ICD-10-CM

## 2023-07-15 DIAGNOSIS — E119 Type 2 diabetes mellitus without complications: Secondary | ICD-10-CM | POA: Diagnosis not present

## 2023-07-15 DIAGNOSIS — E1165 Type 2 diabetes mellitus with hyperglycemia: Secondary | ICD-10-CM

## 2023-07-15 LAB — POCT GLYCOSYLATED HEMOGLOBIN (HGB A1C): HbA1c, POC (controlled diabetic range): 6.9 % (ref 0.0–7.0)

## 2023-07-15 MED ORDER — AMOXICILLIN-POT CLAVULANATE 875-125 MG PO TABS: 1.0000 | ORAL_TABLET | Freq: Two times a day (BID) | ORAL | 0 refills | Status: AC

## 2023-07-15 MED ORDER — METFORMIN HCL 500 MG PO TABS: ORAL_TABLET | ORAL | 0 refills | Status: DC

## 2023-07-15 NOTE — Progress Notes (Signed)
Patient states a cyst under his left arm.

## 2023-07-15 NOTE — Progress Notes (Signed)
Patient ID: Curtis Washington, male    DOB: 1976-05-23  MRN: [id number]  CC: Chronic Conditions Follow-Up  Subjective: Curtis Washington is a 47 y.o. male who presents for chronic conditions follow-up.   His concerns today include:  - Doing well on Metformin, no issues/concerns. Reports up to date on diabetic eye exam. Denies red flag symptoms associated with diabetes.  - States left underarm cyst drainage and causing discomfort. States hasn't had one in years. Reports he works weekends only at [employer] and requests letter to be out of work from today until next Thursday 07/21/2023. - Established with Cardiology.   Patient Active Problem List   Diagnosis Date Noted   Pure hypercholesterolemia 11/17/2021   Generalized anxiety disorder 11/03/2021   Type 2 diabetes mellitus (HCC) 09/16/2020   Essential hypertension 09/08/2020     Current Outpatient Medications on File Prior to Visit  Medication Sig Dispense Refill   amLODipine (NORVASC) 10 MG tablet Take 1 tablet (10 mg total) by mouth daily. 90 tablet 3   atorvastatin (LIPITOR) 20 MG tablet Take 1 tablet (20 mg total) by mouth daily. 90 tablet 3   carvedilol (COREG) 12.5 MG tablet Take 1 tablet (12.5 mg total) by mouth 2 (two) times daily. 180 tablet 3   chlorthalidone (HYGROTON) 25 MG tablet Take 1 tablet (25 mg total) by mouth daily. 90 tablet 3   No current facility-administered medications on file prior to visit.    No Known Allergies  Social History   Socioeconomic History   Marital status: Divorced    Spouse name: Not on file   Number of children: Not on file   Years of education: Not on file   Highest education level: Not on file  Occupational History   Not on file  Tobacco Use   Smoking status: Never    Passive exposure: Yes   Smokeless tobacco: Never  Vaping Use   Vaping status: Never Used  Substance and Sexual Activity   Alcohol use: Yes    Alcohol/week: 2.0 standard drinks of alcohol    Types: 2 Standard drinks or  equivalent per week    Comment: socially   Drug use: Not Currently   Sexual activity: Not Currently  Other Topics Concern   Not on file  Social History Narrative   ** Merged History Encounter **       Social Drivers of Health   Financial Resource Strain: Not on file  Food Insecurity: Not on file  Transportation Needs: Not on file  Physical Activity: Not on file  Stress: Not on file (06/02/2023)  Social Connections: Not on file  Intimate Partner Violence: Not on file    Family History  Problem Relation Age of Onset   Diabetes Mother    Hyperlipidemia Mother    Hypertension Mother    Cancer Mother    Diabetes Sister    Heart disease Sister    Hyperlipidemia Sister    Alcohol abuse Father    Cancer Maternal Grandmother    Dementia Maternal Grandfather     No past surgical history on file.  ROS: Review of Systems Negative except as stated above  PHYSICAL EXAM: BP (!) 143/94   Pulse 81   Temp 97.9 F (36.6 C) (Oral)   Ht 5\' 9"  (1.753 m)   Wt 245 lb 12.8 oz (111.5 kg)   SpO2 94%   BMI 36.30 kg/m   Physical Exam HENT:     Head: Normocephalic and atraumatic.  Nose: Nose normal.     Mouth/Throat:     Mouth: Mucous membranes are moist.     Pharynx: Oropharynx is clear.  Eyes:     Extraocular Movements: Extraocular movements intact.     Conjunctiva/sclera: Conjunctivae normal.     Pupils: Pupils are equal, round, and reactive to light.  Cardiovascular:     Rate and Rhythm: Normal rate and regular rhythm.     Pulses: Normal pulses.     Heart sounds: Normal heart sounds.  Pulmonary:     Effort: Pulmonary effort is normal.     Breath sounds: Normal breath sounds.  Musculoskeletal:        General: Normal range of motion.     Cervical back: Normal range of motion and neck supple.  Skin:    Comments: Epidermoid cyst left underarm with drainage.   Neurological:     General: No focal deficit present.     Mental Status: He is alert and oriented to person,  place, and time.  Psychiatric:        Mood and Affect: Mood normal.        Behavior: Behavior normal.    Results for orders placed or performed in visit on 07/15/23  POCT glycosylated hemoglobin (Hb A1C)  Result Value Ref Range   Hemoglobin A1C     HbA1c POC (<> result, manual entry)     HbA1c, POC (prediabetic range)     HbA1c, POC (controlled diabetic range) 6.9 0.0 - 7.0 %    ASSESSMENT AND PLAN: 1. Type 2 diabetes mellitus with hyperglycemia, without long-term current use of insulin (HCC) (Primary) - Hemoglobin A1c at goal at 6.9%, goal 7%.  - Continue Metformin as prescribed. Counseled on medication adherence/adverse effects.  - Discussed the importance of healthy eating habits, low-carbohydrate diet, low-sugar diet, regular aerobic exercise (at least 150 minutes a week as tolerated) and medication compliance to achieve or maintain control of diabetes. - Follow-up with primary provider in 3 months or sooner if needed.  - POCT glycosylated hemoglobin (Hb A1C) - metFORMIN (GLUCOPHAGE) 500 MG tablet; TAKE 1 TABLET BY MOUTH EVERY DAY WITH BREAKFAST  Dispense: 90 tablet; Refill: 0 - Microalbumin / creatinine urine ratio  2. Encounter for diabetic foot exam [facility]) - Referral to Podiatry for evaluation/management.  - Ambulatory referral to Podiatry  3. Epidermoid cyst of skin - Amoxicillin-Clavulanate as prescribed. Counseled on medication adherence/adverse effects.  - Patient provided with work letter per request.  - Referral to Dermatology for evaluation/management.  - Follow-up with primary provider as scheduled.  - Ambulatory referral to Dermatology - amoxicillin-clavulanate (AUGMENTIN) 875-125 MG tablet; Take 1 tablet by mouth 2 (two) times daily.  Dispense: 20 tablet; Refill: 0    Patient was given the opportunity to ask questions.  Patient verbalized understanding of the plan and was able to repeat key elements of the plan. Patient was given clear instructions to go to  Emergency Department or return to medical center if symptoms don't improve, worsen, or new problems develop.The patient verbalized understanding.   Orders Placed This Encounter  Procedures   Microalbumin / creatinine urine ratio   Ambulatory referral to Podiatry   Ambulatory referral to Dermatology   POCT glycosylated hemoglobin (Hb A1C)     Requested Prescriptions   Signed Prescriptions Disp Refills   metFORMIN (GLUCOPHAGE) 500 MG tablet 90 tablet 0    Sig: TAKE 1 TABLET BY MOUTH EVERY DAY WITH BREAKFAST   amoxicillin-clavulanate (AUGMENTIN) 875-125 MG tablet 20 tablet 0  Sig: Take 1 tablet by mouth 2 (two) times daily.    Return in about 3 months (around 10/13/2023) for Follow-Up or next available chronic conditions.  Rema Fendt, NP

## 2023-07-16 LAB — MICROALBUMIN / CREATININE URINE RATIO
Creatinine, Urine: 225.5 mg/dL
Microalb/Creat Ratio: 12 mg/g{creat} (ref 0–29)
Microalbumin, Urine: 26.9 ug/mL

## 2023-07-18 ENCOUNTER — Encounter: Payer: Self-pay | Admitting: Family

## 2023-07-18 NOTE — Telephone Encounter (Signed)
 Care team updated and letter sent for eye exam notes.

## 2023-07-29 DIAGNOSIS — L728 Other follicular cysts of the skin and subcutaneous tissue: Secondary | ICD-10-CM | POA: Diagnosis not present

## 2023-08-02 ENCOUNTER — Ambulatory Visit: Payer: BC Managed Care – PPO | Admitting: Podiatry

## 2023-08-02 ENCOUNTER — Telehealth: Payer: Self-pay | Admitting: Family

## 2023-08-02 NOTE — Telephone Encounter (Signed)
 Marland Kitchen

## 2023-08-16 ENCOUNTER — Other Ambulatory Visit (HOSPITAL_COMMUNITY): Payer: Self-pay

## 2023-08-17 ENCOUNTER — Other Ambulatory Visit: Payer: Self-pay

## 2023-08-17 ENCOUNTER — Encounter: Payer: Self-pay | Admitting: Pharmacist

## 2023-08-17 ENCOUNTER — Other Ambulatory Visit (HOSPITAL_COMMUNITY): Payer: Self-pay

## 2023-08-17 ENCOUNTER — Encounter (HOSPITAL_BASED_OUTPATIENT_CLINIC_OR_DEPARTMENT_OTHER): Payer: BC Managed Care – PPO

## 2023-08-22 ENCOUNTER — Other Ambulatory Visit: Payer: Self-pay

## 2023-08-25 ENCOUNTER — Encounter: Payer: Self-pay | Admitting: Family

## 2023-08-26 NOTE — Telephone Encounter (Signed)
 Schedule appointment?

## 2023-08-26 NOTE — Telephone Encounter (Signed)
I have printed the form and placed in provider's box upfront .

## 2023-08-26 NOTE — Telephone Encounter (Signed)
Please let patient know that an appointment needs to be scheduled , virtual or in-person.

## 2023-08-29 ENCOUNTER — Telehealth: Payer: Self-pay | Admitting: Family

## 2023-08-29 NOTE — Telephone Encounter (Signed)
Called pt and left vm to call office back to schedule appt to fill out accommodation paperwork with provider

## 2023-08-31 ENCOUNTER — Other Ambulatory Visit: Payer: Self-pay

## 2023-08-31 ENCOUNTER — Telehealth (INDEPENDENT_AMBULATORY_CARE_PROVIDER_SITE_OTHER): Payer: BC Managed Care – PPO | Admitting: Family

## 2023-08-31 ENCOUNTER — Other Ambulatory Visit (HOSPITAL_COMMUNITY): Payer: Self-pay

## 2023-08-31 DIAGNOSIS — Z0289 Encounter for other administrative examinations: Secondary | ICD-10-CM

## 2023-08-31 DIAGNOSIS — L720 Epidermal cyst: Secondary | ICD-10-CM | POA: Diagnosis not present

## 2023-08-31 NOTE — Progress Notes (Signed)
 Virtual Visit via Video Note  I connected with Curtis Washington, on 08/31/2023 at 3:27 PM by video and verified that I am speaking with the correct person using two identifiers.  Consent: I discussed the limitations, risks, security and privacy concerns of performing an evaluation and management service by video and the availability of in person appointments. I also discussed with the patient that there may be a patient responsible charge related to this service. The patient expressed understanding and agreed to proceed.   Location of Patient: Home  Location of Provider: [facility]   Persons participating in Telemedicine visit: Curtis Washington Greig Lorren, NP Curtistine Quiet, CMA   History of Present Illness: Curtis Washington is a 47 y.o. male who presents for paperwork completion.   His issues/concerns for discussion today includes: Reports he is a part time employee at [employer]. States he was out of work from 07/15/2023 through 07/21/2023 due to epidermoid cyst of skin (left armpit). Since then has improved and returned to work on 07/22/2023 with no restrictions. Established with Dermatology. No further issues/concerns for discussion today.    Past Medical History:  Diagnosis Date   Essential hypertension 09/08/2020   Hypertension    Pure hypercholesterolemia 11/17/2021   No Known Allergies  Current Outpatient Medications on File Prior to Visit  Medication Sig Dispense Refill   amLODipine  (NORVASC ) 10 MG tablet Take 1 tablet (10 mg total) by mouth daily. 90 tablet 3   amoxicillin -clavulanate (AUGMENTIN ) 875-125 MG tablet Take 1 tablet by mouth 2 (two) times daily. 20 tablet 0   atorvastatin  (LIPITOR) 20 MG tablet Take 1 tablet (20 mg total) by mouth daily. 90 tablet 3   carvedilol  (COREG ) 12.5 MG tablet Take 1 tablet (12.5 mg total) by mouth 2 (two) times daily. 180 tablet 3   chlorthalidone  (HYGROTON ) 25 MG tablet Take 1 tablet (25 mg total) by mouth daily. 90  tablet 3   metFORMIN  (GLUCOPHAGE ) 500 MG tablet TAKE 1 TABLET BY MOUTH EVERY DAY WITH BREAKFAST 90 tablet 0   No current facility-administered medications on file prior to visit.    Observations/Objective: Alert and oriented x 3. Not in acute distress. Physical examination not completed as this is a telemedicine visit.  Assessment and Plan: 1. Encounter for completion of form with patient (Primary) 2. Epidermoid cyst of skin - Health Care Provider Form - Leave as an Accomodation form completed.   Follow Up Instructions: Follow-up with primary provider as scheduled.   Patient was given clear instructions to go to Emergency Department or return to medical center if symptoms don't improve, worsen, or new problems develop.The patient verbalized understanding.  I discussed the assessment and treatment plan with the patient. The patient was provided an opportunity to ask questions and all were answered. The patient agreed with the plan and demonstrated an understanding of the instructions.   The patient was advised to call back or seek an in-person evaluation if the symptoms worsen or if the condition fails to improve as anticipated.     I provided 10 minutes total of non-face-to-face time during this encounter.   Greig JINNY Lorren, NP  [facility] [place], [place] [phone] 08/31/2023, 3:27 PM

## 2023-09-01 ENCOUNTER — Encounter: Payer: Self-pay | Admitting: Pharmacist

## 2023-09-01 ENCOUNTER — Other Ambulatory Visit (HOSPITAL_COMMUNITY): Payer: Self-pay

## 2023-09-01 ENCOUNTER — Other Ambulatory Visit: Payer: Self-pay

## 2023-09-02 ENCOUNTER — Other Ambulatory Visit: Payer: Self-pay

## 2023-09-06 ENCOUNTER — Other Ambulatory Visit: Payer: Self-pay

## 2023-09-07 ENCOUNTER — Ambulatory Visit (HOSPITAL_BASED_OUTPATIENT_CLINIC_OR_DEPARTMENT_OTHER): Payer: BC Managed Care – PPO

## 2023-09-07 ENCOUNTER — Encounter (HOSPITAL_BASED_OUTPATIENT_CLINIC_OR_DEPARTMENT_OTHER): Payer: Self-pay | Admitting: Cardiovascular Disease

## 2023-09-07 DIAGNOSIS — I10 Essential (primary) hypertension: Secondary | ICD-10-CM | POA: Diagnosis not present

## 2023-09-08 ENCOUNTER — Encounter (HOSPITAL_BASED_OUTPATIENT_CLINIC_OR_DEPARTMENT_OTHER): Payer: Self-pay

## 2023-10-12 ENCOUNTER — Other Ambulatory Visit: Payer: Self-pay

## 2023-10-12 ENCOUNTER — Other Ambulatory Visit: Payer: Self-pay | Admitting: Family

## 2023-10-12 DIAGNOSIS — E1165 Type 2 diabetes mellitus with hyperglycemia: Secondary | ICD-10-CM

## 2023-10-12 MED ORDER — METFORMIN HCL 500 MG PO TABS: ORAL_TABLET | ORAL | 0 refills | Status: AC

## 2023-10-14 ENCOUNTER — Ambulatory Visit: Payer: BC Managed Care – PPO | Admitting: Family

## 2023-10-14 ENCOUNTER — Encounter: Payer: Self-pay | Admitting: Family

## 2023-10-14 VITALS — BP 167/114 | HR 92 | Temp 99.1°F | Ht 68.0 in | Wt 244.4 lb

## 2023-10-14 DIAGNOSIS — E119 Type 2 diabetes mellitus without complications: Secondary | ICD-10-CM

## 2023-10-14 DIAGNOSIS — Z7984 Long term (current) use of oral hypoglycemic drugs: Secondary | ICD-10-CM

## 2023-10-14 LAB — POCT GLYCOSYLATED HEMOGLOBIN (HGB A1C): HbA1c, POC (controlled diabetic range): 6.6 % (ref 0.0–7.0)

## 2023-10-14 NOTE — Progress Notes (Signed)
No concerns to discuss.

## 2023-10-14 NOTE — Progress Notes (Signed)
 Patient ID: Curtis Washington, male    DOB: September 01, 1975  MRN: [id number]  CC: Chronic Conditions Follow-Up  Subjective: Curtis Washington is a 48 y.o. male who presents for chronic conditions follow-up.   His concerns today include:  - Doing well on Metformin, no issues/concerns. Denies red flag symptoms associated with diabetes.  - Established with Cardiology for chronic conditions. He does not complain of red flag symptoms such as but not limited to chest pain, shortness of breath, worst headache of life, nausea/vomiting.   Patient Active Problem List   Diagnosis Date Noted   Pure hypercholesterolemia 11/17/2021   Generalized anxiety disorder 11/03/2021   Type 2 diabetes mellitus (HCC) 09/16/2020   Essential hypertension 09/08/2020     Current Outpatient Medications on File Prior to Visit  Medication Sig Dispense Refill   metFORMIN (GLUCOPHAGE) 500 MG tablet TAKE 1 TABLET BY MOUTH EVERY DAY WITH BREAKFAST 90 tablet 0   amLODipine (NORVASC) 10 MG tablet Take 1 tablet (10 mg total) by mouth daily. 90 tablet 3   amoxicillin-clavulanate (AUGMENTIN) 875-125 MG tablet Take 1 tablet by mouth 2 (two) times daily. (Patient not taking: Reported on 10/14/2023) 20 tablet 0   atorvastatin (LIPITOR) 20 MG tablet Take 1 tablet (20 mg total) by mouth daily. 90 tablet 3   carvedilol (COREG) 12.5 MG tablet Take 1 tablet (12.5 mg total) by mouth 2 (two) times daily. 180 tablet 3   chlorthalidone (HYGROTON) 25 MG tablet Take 1 tablet (25 mg total) by mouth daily. 90 tablet 3   metFORMIN (GLUCOPHAGE) 500 MG tablet TAKE 1 TABLET BY MOUTH EVERY DAY WITH BREAKFAST (Patient not taking: Reported on 10/14/2023) 90 tablet 0   No current facility-administered medications on file prior to visit.    No Known Allergies  Social History   Socioeconomic History   Marital status: Divorced    Spouse name: Not on file   Number of children: Not on file   Years of education: Not on file   Highest education level: Not on file   Occupational History   Not on file  Tobacco Use   Smoking status: Never    Passive exposure: Yes   Smokeless tobacco: Never  Vaping Use   Vaping status: Never Used  Substance and Sexual Activity   Alcohol use: Yes    Alcohol/week: 2.0 standard drinks of alcohol    Types: 2 Standard drinks or equivalent per week    Comment: socially   Drug use: Not Currently   Sexual activity: Not Currently  Other Topics Concern   Not on file  Social History Narrative   ** Merged History Encounter **       Social Drivers of Health   Financial Resource Strain: Not on file  Food Insecurity: Not on file  Transportation Needs: Not on file  Physical Activity: Not on file  Stress: Not on file (06/02/2023)  Social Connections: Not on file  Intimate Partner Violence: Not on file    Family History  Problem Relation Age of Onset   Diabetes Mother    Hyperlipidemia Mother    Hypertension Mother    Cancer Mother    Diabetes Sister    Heart disease Sister    Hyperlipidemia Sister    Alcohol abuse Father    Cancer Maternal Grandmother    Dementia Maternal Grandfather     No past surgical history on file.  ROS: Review of Systems Negative except as stated above  PHYSICAL EXAM: BP (!) 167/114  Pulse 92   Temp 99.1 F (37.3 C) (Oral)   Ht 5\' 8"  (1.727 m)   Wt 244 lb 6.4 oz (110.9 kg)   SpO2 92%   BMI 37.16 kg/m   Physical Exam HENT:     Head: Normocephalic and atraumatic.     Nose: Nose normal.     Mouth/Throat:     Mouth: Mucous membranes are moist.     Pharynx: Oropharynx is clear.  Eyes:     Extraocular Movements: Extraocular movements intact.     Conjunctiva/sclera: Conjunctivae normal.     Pupils: Pupils are equal, round, and reactive to light.  Cardiovascular:     Rate and Rhythm: Normal rate and regular rhythm.     Pulses: Normal pulses.     Heart sounds: Normal heart sounds.  Pulmonary:     Effort: Pulmonary effort is normal.     Breath sounds: Normal breath  sounds.  Musculoskeletal:        General: Normal range of motion.     Cervical back: Normal range of motion and neck supple.  Neurological:     General: No focal deficit present.     Mental Status: He is alert and oriented to person, place, and time.  Psychiatric:        Mood and Affect: Mood normal.        Behavior: Behavior normal.     Results for orders placed or performed in visit on 10/14/23  POCT glycosylated hemoglobin (Hb A1C)  Result Value Ref Range   Hemoglobin A1C     HbA1c POC (<> result, manual entry)     HbA1c, POC (prediabetic range)     HbA1c, POC (controlled diabetic range) 6.6 0.0 - 7.0 %    ASSESSMENT AND PLAN: 1. Type 2 diabetes mellitus without complication, without long-term current use of insulin (HCC) (Primary) - Hemoglobin A1c at goal at 6.6%, goal 7%.  - Continue Metformin as prescribed. No refills needed as of present.  - Routine screening.  - Discussed the importance of healthy eating habits, low-carbohydrate diet, low-sugar diet, regular aerobic exercise (at least 150 minutes a week as tolerated) and medication compliance to achieve or maintain control of diabetes. - Follow-up with primary provider in 3 months or sooner if needed. - POCT glycosylated hemoglobin (Hb A1C) - Basic Metabolic Panel  2. Encounter for diabetic foot exam [facility]) - Referral to Podiatry for evaluation/management. - Ambulatory referral to Podiatry    Patient was given the opportunity to ask questions.  Patient verbalized understanding of the plan and was able to repeat key elements of the plan. Patient was given clear instructions to go to Emergency Department or return to medical center if symptoms don't improve, worsen, or new problems develop.The patient verbalized understanding.   Orders Placed This Encounter  Procedures   Basic Metabolic Panel   Ambulatory referral to Podiatry   POCT glycosylated hemoglobin (Hb A1C)     Return in about 3 months (around 01/14/2024)  for Follow-Up or next available chronic conditions.  Rema Fendt, NP

## 2023-10-15 ENCOUNTER — Encounter (HOSPITAL_BASED_OUTPATIENT_CLINIC_OR_DEPARTMENT_OTHER): Payer: Self-pay

## 2023-10-15 DIAGNOSIS — I10 Essential (primary) hypertension: Secondary | ICD-10-CM

## 2023-10-15 LAB — BASIC METABOLIC PANEL
BUN/Creatinine Ratio: 14 (ref 9–20)
BUN: 13 mg/dL (ref 6–24)
CO2: 21 mmol/L (ref 20–29)
Calcium: 9.8 mg/dL (ref 8.7–10.2)
Chloride: 104 mmol/L (ref 96–106)
Creatinine, Ser: 0.9 mg/dL (ref 0.76–1.27)
Glucose: 97 mg/dL (ref 70–99)
Potassium: 4.2 mmol/L (ref 3.5–5.2)
Sodium: 141 mmol/L (ref 134–144)
eGFR: 105 mL/min/{1.73_m2} (ref >59)

## 2023-10-17 ENCOUNTER — Encounter: Payer: Self-pay | Admitting: Family

## 2023-10-17 MED ORDER — CARVEDILOL 12.5 MG PO TABS: 12.5000 mg | ORAL_TABLET | Freq: Two times a day (BID) | ORAL | 3 refills | Status: AC

## 2023-10-17 MED ORDER — ATORVASTATIN CALCIUM 20 MG PO TABS
20.0000 mg | ORAL_TABLET | Freq: Every day | ORAL | 3 refills | Status: AC
Start: 2023-10-17 — End: 2024-04-02

## 2023-10-17 MED ORDER — CHLORTHALIDONE 25 MG PO TABS: 25.0000 mg | ORAL_TABLET | Freq: Every day | ORAL | 3 refills | Status: AC

## 2023-10-17 MED ORDER — AMLODIPINE BESYLATE 10 MG PO TABS: 10.0000 mg | ORAL_TABLET | Freq: Every day | ORAL | 3 refills | Status: AC

## 2023-10-18 ENCOUNTER — Ambulatory Visit: Admitting: Family

## 2023-10-18 VITALS — BP 164/115 | HR 98 | Temp 98.6°F | Ht 69.0 in | Wt 246.2 lb

## 2023-10-18 DIAGNOSIS — Z1211 Encounter for screening for malignant neoplasm of colon: Secondary | ICD-10-CM

## 2023-10-18 DIAGNOSIS — K649 Unspecified hemorrhoids: Secondary | ICD-10-CM

## 2023-10-18 MED ORDER — HYDROCORTISONE ACETATE 25 MG RE SUPP: 25.0000 mg | Freq: Two times a day (BID) | RECTAL | 1 refills | Status: AC | PRN

## 2023-10-18 NOTE — Progress Notes (Unsigned)
 Patient ID: Curtis Washington, male    DOB: 03/29/1976  MRN: [id number]  CC: Knot  Subjective: Curtis Washington is a 48 y.o. male who presents for knot.   His concerns today include: ***  Patient Active Problem List   Diagnosis Date Noted   Pure hypercholesterolemia 11/17/2021   Generalized anxiety disorder 11/03/2021   Type 2 diabetes mellitus (HCC) 09/16/2020   Essential hypertension 09/08/2020     Current Outpatient Medications on File Prior to Visit  Medication Sig Dispense Refill   amLODipine (NORVASC) 10 MG tablet Take 1 tablet (10 mg total) by mouth daily. 90 tablet 3   amoxicillin-clavulanate (AUGMENTIN) 875-125 MG tablet Take 1 tablet by mouth 2 (two) times daily. 20 tablet 0   atorvastatin (LIPITOR) 20 MG tablet Take 1 tablet (20 mg total) by mouth daily. 90 tablet 3   carvedilol (COREG) 12.5 MG tablet Take 1 tablet (12.5 mg total) by mouth 2 (two) times daily. 180 tablet 3   chlorthalidone (HYGROTON) 25 MG tablet Take 1 tablet (25 mg total) by mouth daily. 90 tablet 3   metFORMIN (GLUCOPHAGE) 500 MG tablet TAKE 1 TABLET BY MOUTH EVERY DAY WITH BREAKFAST 90 tablet 0   metFORMIN (GLUCOPHAGE) 500 MG tablet TAKE 1 TABLET BY MOUTH EVERY DAY WITH BREAKFAST 90 tablet 0   No current facility-administered medications on file prior to visit.    No Known Allergies  Social History   Socioeconomic History   Marital status: Divorced    Spouse name: Not on file   Number of children: Not on file   Years of education: Not on file   Highest education level: 12th grade  Occupational History   Not on file  Tobacco Use   Smoking status: Never    Passive exposure: Yes   Smokeless tobacco: Never  Vaping Use   Vaping status: Never Used  Substance and Sexual Activity   Alcohol use: Yes    Alcohol/week: 2.0 standard drinks of alcohol    Types: 2 Standard drinks or equivalent per week    Comment: socially   Drug use: Not Currently   Sexual activity: Not Currently  Other Topics Concern    Not on file  Social History Narrative   ** Merged History Encounter **       Social Drivers of Health   Financial Resource Strain: Low Risk  (10/18/2023)   Overall Financial Resource Strain (CARDIA)    Difficulty of Paying Living Expenses: Not very hard  Food Insecurity: No Food Insecurity (10/18/2023)   Hunger Vital Sign    Worried About Running Out of Food in the Last Year: Never true    Ran Out of Food in the Last Year: Never true  Transportation Needs: No Transportation Needs (10/18/2023)   PRAPARE - Administrator, Civil Service (Medical): No    Lack of Transportation (Non-Medical): No  Physical Activity: Sufficiently Active (10/18/2023)   Exercise Vital Sign    Days of Exercise per Week: 3 days    Minutes of Exercise per Session: 70 min  Stress: No Stress Concern Present (10/18/2023)   [employer] of Occupational Health - Occupational Stress Questionnaire    Feeling of Stress : Only a little  Social Connections: Moderately Isolated (10/18/2023)   Social Connection and Isolation Panel [NHANES]    Frequency of Communication with Friends and Family: More than three times a week    Frequency of Social Gatherings with Friends and Family: Once a week  Attends Religious Services: More than 4 times per year    Active Member of Clubs or Organizations: No    Attends Engineer, structural: Not on file    Marital Status: Divorced  Catering manager Violence: Not on file    Family History  Problem Relation Age of Onset   Diabetes Mother    Hyperlipidemia Mother    Hypertension Mother    Cancer Mother    Diabetes Sister    Heart disease Sister    Hyperlipidemia Sister    Alcohol abuse Father    Cancer Maternal Grandmother    Dementia Maternal Grandfather     No past surgical history on file.  ROS: Review of Systems Negative except as stated above  PHYSICAL EXAM: BP (!) 164/115   Pulse 98   Temp 98.6 F (37 C) (Oral)   Ht 5\' 9"  (1.753 m)   Wt  246 lb 3.2 oz (111.7 kg)   SpO2 93%   BMI 36.36 kg/m   Physical Exam  {male adult master:310786} {male adult master:310785}     Latest Ref Rng & Units 10/14/2023    3:12 PM 05/05/2023    3:51 PM 09/24/2022    9:05 PM  CMP  Glucose 70 - 99 mg/dL 97  696  85   BUN 6 - 24 mg/dL 13  15  12    Creatinine 0.76 - 1.27 mg/dL 2.95  2.84  1.32   Sodium 134 - 144 mmol/L 141  143  140   Potassium 3.5 - 5.2 mmol/L 4.2  3.8  4.2   Chloride 96 - 106 mmol/L 104  104  102   CO2 20 - 29 mmol/L 21  23  21    Calcium 8.7 - 10.2 mg/dL 9.8  44.0  9.5   Total Protein 6.0 - 8.5 g/dL  7.3    Total Bilirubin 0.0 - 1.2 mg/dL  0.4    Alkaline Phos 44 - 121 IU/L  67    AST 0 - 40 IU/L  20    ALT 0 - 44 IU/L  22     Lipid Panel     Component Value Date/Time   CHOL 135 10/09/2021 1429   TRIG 75 10/09/2021 1429   HDL 38 (L) 10/09/2021 1429   CHOLHDL 3.6 10/09/2021 1429   LDLCALC 82 10/09/2021 1429    CBC    Component Value Date/Time   WBC 9.5 05/05/2023 1551   WBC 9.6 11/06/2021 0943   WBC 7.8 08/19/2018 1331   RBC 5.58 05/05/2023 1551   RBC 6.16 (H) 11/06/2021 0943   RBC 6.22 (H) 11/06/2021 0943   HGB 14.3 05/05/2023 1551   HCT 45.0 05/05/2023 1551   PLT 263 05/05/2023 1551   MCV 81 05/05/2023 1551   MCH 25.6 (L) 05/05/2023 1551   MCH 25.2 (L) 11/06/2021 0943   MCHC 31.8 05/05/2023 1551   MCHC 32.0 11/06/2021 0943   RDW 14.0 05/05/2023 1551   LYMPHSABS 3.0 11/06/2021 0943   LYMPHSABS 4.1 (H) 06/30/2020 1842   MONOABS 0.6 11/06/2021 0943   EOSABS 0.1 11/06/2021 0943   EOSABS 0.1 06/30/2020 1842   BASOSABS 0.1 11/06/2021 0943   BASOSABS 0.1 06/30/2020 1842    ASSESSMENT AND PLAN:  There are no diagnoses linked to this encounter.   Patient was given the opportunity to ask questions.  Patient verbalized understanding of the plan and was able to repeat key elements of the plan. Patient was given clear instructions to go to Emergency  Department or return to medical center if symptoms  don't improve, worsen, or new problems develop.The patient verbalized understanding.   Orders Placed This Encounter  Procedures   Ambulatory referral to Gastroenterology     Requested Prescriptions   Signed Prescriptions Disp Refills   hydrocortisone (ANUSOL-HC) 25 MG suppository 12 suppository 1    Sig: Place 1 suppository (25 mg total) rectally 2 (two) times daily as needed for hemorrhoids or anal itching.    No follow-ups on file.  Rema Fendt, NP

## 2023-10-18 NOTE — Progress Notes (Unsigned)
 Patient states knot inside of bottom. States pain when he poops, as well as pain if you touch it.

## 2023-10-27 ENCOUNTER — Encounter (HOSPITAL_BASED_OUTPATIENT_CLINIC_OR_DEPARTMENT_OTHER): Payer: BC Managed Care – PPO | Admitting: Family

## 2023-10-28 ENCOUNTER — Ambulatory Visit: Admitting: Podiatry

## 2023-11-03 ENCOUNTER — Ambulatory Visit: Admitting: Podiatry

## 2023-11-08 ENCOUNTER — Ambulatory Visit: Admitting: Family

## 2023-11-17 ENCOUNTER — Ambulatory Visit: Admitting: Podiatry

## 2023-11-25 ENCOUNTER — Ambulatory Visit: Admitting: Podiatry

## 2023-12-29 ENCOUNTER — Encounter (HOSPITAL_BASED_OUTPATIENT_CLINIC_OR_DEPARTMENT_OTHER): Admitting: Family

## 2024-01-20 ENCOUNTER — Telehealth (INDEPENDENT_AMBULATORY_CARE_PROVIDER_SITE_OTHER): Admitting: Family

## 2024-01-20 DIAGNOSIS — I10 Essential (primary) hypertension: Secondary | ICD-10-CM

## 2024-01-20 DIAGNOSIS — N529 Male erectile dysfunction, unspecified: Secondary | ICD-10-CM | POA: Diagnosis not present

## 2024-01-20 NOTE — Progress Notes (Signed)
 Virtual Visit via Video Note  I connected with Arley JONETTA Ruth, on 01/20/2024 at 2:41 PM by video and verified that I am speaking with the correct person using two identifiers.  Consent: I discussed the limitations, risks, security and privacy concerns of performing an evaluation and management service by video and the availability of in person appointments. I also discussed with the patient that there may be a patient responsible charge related to this service. The patient expressed understanding and agreed to proceed.   Location of Patient: Home  Location of Provider:  Primary Care at [facility]   Persons participating in Telemedicine visit: Arley JONETTA Ruth Greig Lorren, NP Purvis Pepper, CMA   History of Present Illness: Duglas Heier is a 48 y.o. male who presents for blood pressure follow-up. States his blood pressure medications (unsure of which one) are causing erectile dysfunction and wants to know if there is another blood pressure medication he can take. States he was recently see by Cardiology and called them on today but was told the provider is out of office. Patient states he will not take his blood pressure medication beginning this weekend because he is going on a trip to the [place]. No further issues/concerns for discussion today.    Past Medical History:  Diagnosis Date   Essential hypertension 09/08/2020   Hypertension    Pure hypercholesterolemia 11/17/2021   No Known Allergies  Current Outpatient Medications on File Prior to Visit  Medication Sig Dispense Refill   amLODipine  (NORVASC ) 10 MG tablet Take 1 tablet (10 mg total) by mouth daily. 90 tablet 3   amoxicillin -clavulanate (AUGMENTIN ) 875-125 MG tablet Take 1 tablet by mouth 2 (two) times daily. 20 tablet 0   atorvastatin  (LIPITOR) 20 MG tablet Take 1 tablet (20 mg total) by mouth daily. 90 tablet 3   carvedilol  (COREG ) 12.5 MG tablet Take 1 tablet (12.5 mg total) by mouth 2 (two) times daily. 180 tablet  3   chlorthalidone  (HYGROTON ) 25 MG tablet Take 1 tablet (25 mg total) by mouth daily. 90 tablet 3   hydrocortisone  (ANUSOL -HC) 25 MG suppository Place 1 suppository (25 mg total) rectally 2 (two) times daily as needed for hemorrhoids or anal itching. 12 suppository 1   metFORMIN  (GLUCOPHAGE ) 500 MG tablet TAKE 1 TABLET BY MOUTH EVERY DAY WITH BREAKFAST 90 tablet 0   metFORMIN  (GLUCOPHAGE ) 500 MG tablet TAKE 1 TABLET BY MOUTH EVERY DAY WITH BREAKFAST 90 tablet 0   No current facility-administered medications on file prior to visit.    Observations/Objective: Alert and oriented x 3. Not in acute distress. Physical examination not completed as this is a telemedicine visit.  Assessment and Plan: 1. Primary hypertension (Primary) 2. Erectile dysfunction, unspecified erectile dysfunction type - Patient states his blood pressure medications (he is unsure of which one) is causing erectile dysfunction and he wants to know if there is another blood pressure medication he can take as an alternative. Patient states he was recently see by Cardiology and called them on today for advisement but was told the provider is out of office. Patient states he will not take his blood pressure medications beginning this weekend because he is going on a trip to the [place]. - I discussed with patient in detail adverse effects that may happen if he does not take his blood pressure medications. Patient counseled to call Cardiology for advisement and to see if there is a provider covering since his cardiologist provider is out of office. Patient verbalized understanding/agreement.  Follow Up Instructions: Follow-up with Cardiology. Follow-up with primary provider as scheduled.   Patient was given clear instructions to go to Emergency Department or return to medical center if symptoms don't improve, worsen, or new problems develop.The patient verbalized understanding.  I discussed the assessment and treatment plan  with the patient. The patient was provided an opportunity to ask questions and all were answered. The patient agreed with the plan and demonstrated an understanding of the instructions.   The patient was advised to call back or seek an in-person evaluation if the symptoms worsen or if the condition fails to improve as anticipated.     I provided 8 minutes total of non-face-to-face time during this encounter.   Greig JINNY Drones, NP  [facility] Primary Care at [facility] [place], [place] [phone] 01/20/2024, 2:41 PM

## 2024-03-14 ENCOUNTER — Encounter (HOSPITAL_BASED_OUTPATIENT_CLINIC_OR_DEPARTMENT_OTHER): Payer: Self-pay

## 2024-03-15 ENCOUNTER — Encounter (HOSPITAL_BASED_OUTPATIENT_CLINIC_OR_DEPARTMENT_OTHER): Admitting: Family

## 2024-04-02 ENCOUNTER — Other Ambulatory Visit: Payer: Self-pay | Admitting: Family

## 2024-04-02 ENCOUNTER — Ambulatory Visit: Admitting: Family

## 2024-04-02 ENCOUNTER — Encounter: Payer: Self-pay | Admitting: Family

## 2024-04-02 ENCOUNTER — Ambulatory Visit: Payer: Self-pay | Admitting: Family

## 2024-04-02 ENCOUNTER — Other Ambulatory Visit (HOSPITAL_COMMUNITY)
Admission: RE | Admit: 2024-04-02 | Discharge: 2024-04-02 | Disposition: A | Discharge status: Home / Self Care | Source: Ambulatory Visit | Attending: Family | Admitting: Family

## 2024-04-02 VITALS — BP 133/95 | HR 96 | Temp 98.2°F | Resp 16 | Ht 69.0 in | Wt 253.0 lb

## 2024-04-02 DIAGNOSIS — R319 Hematuria, unspecified: Secondary | ICD-10-CM

## 2024-04-02 LAB — POCT URINALYSIS DIP (CLINITEK)
Bilirubin, UA: NEGATIVE
Blood, UA: NEGATIVE
Glucose, UA: NEGATIVE mg/dL
Ketones, POC UA: NEGATIVE mg/dL
Leukocytes, UA: NEGATIVE
Nitrite, UA: NEGATIVE
POC PROTEIN,UA: NEGATIVE
Spec Grav, UA: >=1.03 — AB (ref 1.010–1.025)
Urobilinogen, UA: 0.2 U/dL
pH, UA: 5.5 (ref 5.0–8.0)

## 2024-04-02 NOTE — Progress Notes (Signed)
 Patient ID: Curtis Washington, male    DOB: 1975/11/02  MRN: [id number]  CC: Blood In Urine  Subjective: Curtis Washington is a 48 y.o. male who presents for blood in urine.   His concerns today include:  - States blood in urine once two weeks ago. Denies additional symptoms/concerns. - Established with Cardiology for chronic conditions. He does not complain of red flag symptoms such as but not limited to chest pain, shortness of breath, worst headache of life, nausea/vomiting.   Patient Active Problem List   Diagnosis Date Noted   Pure hypercholesterolemia 11/17/2021   Generalized anxiety disorder 11/03/2021   Type 2 diabetes mellitus (HCC) 09/16/2020   Essential hypertension 09/08/2020     Current Outpatient Medications on File Prior to Visit  Medication Sig Dispense Refill   amLODipine  (NORVASC ) 10 MG tablet Take 1 tablet (10 mg total) by mouth daily. 90 tablet 3   amoxicillin -clavulanate (AUGMENTIN ) 875-125 MG tablet Take 1 tablet by mouth 2 (two) times daily. 20 tablet 0   atorvastatin  (LIPITOR) 20 MG tablet Take 1 tablet (20 mg total) by mouth daily. 90 tablet 3   carvedilol  (COREG ) 12.5 MG tablet Take 1 tablet (12.5 mg total) by mouth 2 (two) times daily. 180 tablet 3   chlorthalidone  (HYGROTON ) 25 MG tablet Take 1 tablet (25 mg total) by mouth daily. 90 tablet 3   hydrocortisone  (ANUSOL -HC) 25 MG suppository Place 1 suppository (25 mg total) rectally 2 (two) times daily as needed for hemorrhoids or anal itching. 12 suppository 1   metFORMIN  (GLUCOPHAGE ) 500 MG tablet TAKE 1 TABLET BY MOUTH EVERY DAY WITH BREAKFAST 90 tablet 0   metFORMIN  (GLUCOPHAGE ) 500 MG tablet TAKE 1 TABLET BY MOUTH EVERY DAY WITH BREAKFAST (Patient not taking: Reported on 04/02/2024) 90 tablet 0   No current facility-administered medications on file prior to visit.    No Known Allergies  Social History   Socioeconomic History   Marital status: Divorced    Spouse name: Not on file   Number of children: Not on  file   Years of education: Not on file   Highest education level: Some college, no degree  Occupational History   Not on file  Tobacco Use   Smoking status: Never    Passive exposure: Yes   Smokeless tobacco: Never  Vaping Use   Vaping status: Never Used  Substance and Sexual Activity   Alcohol use: Yes    Alcohol/week: 2.0 standard drinks of alcohol    Types: 2 Standard drinks or equivalent per week    Comment: socially   Drug use: Not Currently   Sexual activity: Not Currently  Other Topics Concern   Not on file  Social History Narrative   ** Merged History Encounter **       Social Drivers of Health   Financial Resource Strain: Medium Risk (04/01/2024)   Overall Financial Resource Strain (CARDIA)    Difficulty of Paying Living Expenses: Somewhat hard  Food Insecurity: No Food Insecurity (04/01/2024)   Hunger Vital Sign    Worried About Running Out of Food in the Last Year: Never true    Ran Out of Food in the Last Year: Never true  Transportation Needs: No Transportation Needs (04/01/2024)   PRAPARE - Administrator, Civil Service (Medical): No    Lack of Transportation (Non-Medical): No  Physical Activity: Sufficiently Active (04/01/2024)   Exercise Vital Sign    Days of Exercise per Week: 5 days  Minutes of Exercise per Session: 150+ min  Stress: Stress Concern Present (04/01/2024)   [employer] of Occupational Health - Occupational Stress Questionnaire    Feeling of Stress: Rather much  Social Connections: Moderately Isolated (04/01/2024)   Social Connection and Isolation Panel    Frequency of Communication with Friends and Family: Three times a week    Frequency of Social Gatherings with Friends and Family: Once a week    Attends Religious Services: More than 4 times per year    Active Member of [employer] or Organizations: No    Attends Engineer, structural: Not on file    Marital Status: Divorced  Catering manager Violence: Not on file     Family History  Problem Relation Age of Onset   Diabetes Mother    Hyperlipidemia Mother    Hypertension Mother    Cancer Mother    Diabetes Sister    Heart disease Sister    Hyperlipidemia Sister    Alcohol abuse Father    Cancer Maternal Grandmother    Dementia Maternal Grandfather     History reviewed. No pertinent surgical history.  ROS: Review of Systems Negative except as stated above  PHYSICAL EXAM: BP (!) 133/95   Pulse 96   Temp 98.2 F (36.8 C) (Oral)   Resp 16   Ht 5' 9 (1.753 m)   Wt 253 lb (114.8 kg)   SpO2 92%   BMI 37.36 kg/m   Physical Exam HENT:     Head: Normocephalic and atraumatic.     Nose: Nose normal.     Mouth/Throat:     Mouth: Mucous membranes are moist.     Pharynx: Oropharynx is clear.  Eyes:     Extraocular Movements: Extraocular movements intact.     Conjunctiva/sclera: Conjunctivae normal.     Pupils: Pupils are equal, round, and reactive to light.  Cardiovascular:     Rate and Rhythm: Normal rate and regular rhythm.     Pulses: Normal pulses.     Heart sounds: Normal heart sounds.  Pulmonary:     Effort: Pulmonary effort is normal.     Breath sounds: Normal breath sounds.  Musculoskeletal:        General: Normal range of motion.     Cervical back: Normal range of motion and neck supple.  Neurological:     General: No focal deficit present.     Mental Status: He is alert and oriented to person, place, and time.  Psychiatric:        Mood and Affect: Mood normal.        Behavior: Behavior normal.    ASSESSMENT AND PLAN: 1. Hematuria, unspecified type (Primary) - Routine screening.  - Referral to Urology for evaluation/management.  - Follow-up with primary provider as scheduled. - POCT URINALYSIS DIP (CLINITEK); Future - Urine Culture - Urine cytology ancillary only - Ambulatory referral to Urology - Basic Metabolic Panel   Patient was given the opportunity to ask questions.  Patient verbalized understanding  of the plan and was able to repeat key elements of the plan. Patient was given clear instructions to go to Emergency Department or return to medical center if symptoms don't improve, worsen, or new problems develop.The patient verbalized understanding.   Orders Placed This Encounter  Procedures   Urine Culture   Basic Metabolic Panel   Ambulatory referral to Urology   POCT URINALYSIS DIP (CLINITEK)    Return for Follow-up as needed.  Greig JINNY Drones, NP

## 2024-04-02 NOTE — Progress Notes (Signed)
 Patient had blood in urine one time and it has never happen again

## 2024-04-03 LAB — URINE CYTOLOGY ANCILLARY ONLY
Chlamydia: NEGATIVE
Comment: NEGATIVE
Comment: NEGATIVE
Comment: NORMAL
Neisseria Gonorrhea: NEGATIVE
Trichomonas: NEGATIVE

## 2024-04-05 ENCOUNTER — Ambulatory Visit: Payer: Self-pay | Admitting: Family

## 2024-04-05 DIAGNOSIS — Z13228 Encounter for screening for other metabolic disorders: Secondary | ICD-10-CM

## 2024-04-05 LAB — BASIC METABOLIC PANEL WITH GFR
BUN/Creatinine Ratio: 13
BUN: 13 mg/dL
CO2: 22 mmol/L (ref 20–29)
Calcium: 9.8 mg/dL
Chloride: 98 mmol/L (ref 96–106)
Creatinine, Ser: 1 mg/dL
Glucose: 132 mg/dL — ABNORMAL HIGH (ref 70–99)
Potassium: 4.6 mmol/L (ref 3.5–5.2)
Sodium: 137 mmol/L (ref 134–144)

## 2024-04-05 LAB — SPECIMEN STATUS REPORT

## 2024-04-06 ENCOUNTER — Ambulatory Visit: Payer: Self-pay | Admitting: Family

## 2024-04-06 DIAGNOSIS — R399 Unspecified symptoms and signs involving the genitourinary system: Secondary | ICD-10-CM

## 2024-04-06 LAB — URINE CULTURE

## 2024-04-06 LAB — SPECIMEN STATUS REPORT

## 2024-04-06 MED ORDER — AMOXICILLIN-POT CLAVULANATE 875-125 MG PO TABS: 1.0000 | ORAL_TABLET | Freq: Two times a day (BID) | ORAL | 0 refills | Status: AC

## 2024-05-14 ENCOUNTER — Encounter (HOSPITAL_BASED_OUTPATIENT_CLINIC_OR_DEPARTMENT_OTHER): Payer: Self-pay

## 2024-05-16 ENCOUNTER — Encounter (HOSPITAL_BASED_OUTPATIENT_CLINIC_OR_DEPARTMENT_OTHER): Payer: Self-pay

## 2024-05-17 ENCOUNTER — Encounter (HOSPITAL_BASED_OUTPATIENT_CLINIC_OR_DEPARTMENT_OTHER): Admitting: Family

## 2024-05-18 NOTE — Progress Notes (Addendum)
 BOUBACAR LERETTE                                          MRN: [id number]   05/18/2024   The [facility] Quality Team Specialist reviewed this patient medical record for the purposes of chart review for care gap closure. The following were reviewed: chart review for care gap closure-controlling blood pressure and kidney health evaluation for diabetes:eGFR  and uACR.GUENTHER DUNSHEE                                          MRN: [id number]   06/08/2024   The [facility] Quality Team Specialist reviewed this patient medical record for the purposes of chart review for care gap closure. The following were reviewed: abstraction for care gap closure-glycemic status assessment.    [facility] Quality Team
